# Patient Record
Sex: Female | Born: 1990 | Race: Black or African American | Hispanic: No | Marital: Single | Smoking: Never smoker
Health system: Southern US, Community
[De-identification: ages and names within clinical notes are randomized; demographics above are authoritative.]

## PROBLEM LIST (undated history)

## (undated) ENCOUNTER — Inpatient Hospital Stay (HOSPITAL_COMMUNITY): Payer: Self-pay

## (undated) ENCOUNTER — Ambulatory Visit: Admission: EM | Discharge status: Absent without leave | Payer: Medicaid Other

## (undated) ENCOUNTER — Emergency Department (HOSPITAL_COMMUNITY): Payer: Medicaid Other

## (undated) ENCOUNTER — Inpatient Hospital Stay (HOSPITAL_COMMUNITY): Payer: Medicaid Other

## (undated) DIAGNOSIS — Z9889 Other specified postprocedural states: Secondary | ICD-10-CM

## (undated) DIAGNOSIS — B999 Unspecified infectious disease: Secondary | ICD-10-CM

## (undated) DIAGNOSIS — O021 Missed abortion: Secondary | ICD-10-CM

## (undated) DIAGNOSIS — A749 Chlamydial infection, unspecified: Secondary | ICD-10-CM

## (undated) DIAGNOSIS — O411290 Chorioamnionitis, unspecified trimester, not applicable or unspecified: Secondary | ICD-10-CM

## (undated) DIAGNOSIS — O403XX0 Polyhydramnios, third trimester, not applicable or unspecified: Secondary | ICD-10-CM

## (undated) HISTORY — DX: Polyhydramnios, third trimester, not applicable or unspecified: O40.3XX0

## (undated) HISTORY — PX: DILATION AND CURETTAGE OF UTERUS: SHX78

## (undated) HISTORY — DX: Other specified postprocedural states: Z98.890

## (undated) HISTORY — DX: Chorioamnionitis, unspecified trimester, not applicable or unspecified: O41.1290

## (undated) HISTORY — DX: Missed abortion: O02.1

---
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
## 2014-06-13 HISTORY — PX: WISDOM TOOTH EXTRACTION: SHX21

## 2016-08-17 ENCOUNTER — Ambulatory Visit (HOSPITAL_COMMUNITY)
Admission: EM | Admit: 2016-08-17 | Discharge: 2016-08-17 | Disposition: A | Discharge status: Home / Self Care | Payer: Medicaid Other | Attending: Family Medicine | Admitting: Family Medicine

## 2016-08-17 ENCOUNTER — Encounter (HOSPITAL_COMMUNITY): Payer: Self-pay | Admitting: Emergency Medicine

## 2016-08-17 DIAGNOSIS — N390 Urinary tract infection, site not specified: Secondary | ICD-10-CM | POA: Diagnosis not present

## 2016-08-17 DIAGNOSIS — Z3202 Encounter for pregnancy test, result negative: Secondary | ICD-10-CM | POA: Diagnosis not present

## 2016-08-17 LAB — POCT URINALYSIS DIP (DEVICE)
Bilirubin Urine: NEGATIVE
GLUCOSE, UA: NEGATIVE mg/dL
Hgb urine dipstick: NEGATIVE
Ketones, ur: NEGATIVE mg/dL
Nitrite: POSITIVE — AB
PROTEIN: NEGATIVE mg/dL
Specific Gravity, Urine: >=1.03 (ref 1.005–1.030)
UROBILINOGEN UA: 0.2 mg/dL (ref 0.0–1.0)
pH: 5.5 (ref 5.0–8.0)

## 2016-08-17 LAB — POCT PREGNANCY, URINE: PREG TEST UR: NEGATIVE

## 2016-08-17 MED ORDER — FLUCONAZOLE 150 MG PO TABS: 150.0000 mg | ORAL_TABLET | Freq: Once | ORAL | 0 refills | Status: AC

## 2016-08-17 MED ORDER — SULFAMETHOXAZOLE-TRIMETHOPRIM 800-160 MG PO TABS: 1.0000 | ORAL_TABLET | Freq: Two times a day (BID) | ORAL | 0 refills | Status: DC

## 2016-08-17 NOTE — ED Triage Notes (Signed)
Pt started having urinary symptoms about a month ago.  Pt was put on antibiotics through the health department.  She stated that the symptoms went away but then she had new symptoms.  She was given Diflucan but the symptoms have not gone away.  Pt states she has a tingling feeling with urination, but denies any back pain or fever.  She does report some lower abdominal pain.

## 2016-08-17 NOTE — ED Notes (Signed)
Patient could only void enough for the cytology specimen. Provided water

## 2016-08-17 NOTE — ED Provider Notes (Addendum)
[facility]    CSN: [id number] Arrival date & time: 08/17/16  1011     History   Chief Complaint Chief Complaint  Patient presents with  . Urinary Tract Infection    HPI Christina Jennings is a 27 y.o. female.   This is a 26 year old woman who presents to the urgent care center with symptoms consistent with urinary tract infection.  Patient had a urinary tract infection about one month ago. She was treated at the health department and symptoms disappear. They started coming back about a week later and she was treated with Diflucan. She has not seen any improvement after taking the Diflucan.  Currently patient is having some burning when she he's and having some suprapubic tenderness. There is no nausea, vomiting, fever, or back pain. Last menstrual period was a little over a month ago. Patient is sexually active and is not taking birth control. She does not think she is pregnant.      History reviewed. No pertinent past medical history.  There are no active problems to display for this patient.   History reviewed. No pertinent surgical history.  OB History    No data available       Home Medications    Prior to Admission medications   Medication Sig Start Date End Date Taking? Authorizing Provider  fluconazole (DIFLUCAN) 150 MG tablet Take 1 tablet (150 mg total) by mouth once. Repeat if needed 08/17/16 08/17/16  Elvina Sidle, MD  sulfamethoxazole-trimethoprim (BACTRIM DS,SEPTRA DS) 800-160 MG tablet Take 1 tablet by mouth 2 (two) times daily. 08/17/16   Elvina Sidle, MD    Family History History reviewed. No pertinent family history.  Social History Social History  Substance Use Topics  . Smoking status: Never Smoker  . Smokeless tobacco: Never Used  . Alcohol use Yes     Comment: occasional     Allergies   Patient has no known allergies.   Review of Systems Review of Systems  Constitutional: Negative.   HENT: Negative.   Respiratory:  Negative.   Cardiovascular: Negative.   Gastrointestinal: Negative.   Genitourinary: Positive for dysuria and pelvic pain. Negative for flank pain, hematuria, menstrual problem and vaginal bleeding.  Neurological: Negative.      Physical Exam Triage Vital Signs ED Triage Vitals  Enc Vitals Group     BP      Pulse      Resp      Temp      Temp src      SpO2      Weight      Height      Head Circumference      Peak Flow      Pain Score      Pain Loc      Pain Edu?      Excl. in GC?    No data found.   Updated Vital Signs BP 110/73 (BP Location: Right Arm)   Pulse 62   Temp 98.2 F (36.8 C) (Oral)   LMP 07/20/2016 (Approximate)   SpO2 100%   Physical Exam  Constitutional: She is oriented to person, place, and time. She appears well-developed and well-nourished.  HENT:  Right Ear: External ear normal.  Left Ear: External ear normal.  Mouth/Throat: Oropharynx is clear and moist.  Eyes: Conjunctivae and EOM are normal. Pupils are equal, round, and reactive to light.  Neck: Normal range of motion. Neck supple.  Pulmonary/Chest: Effort normal.  Abdominal: Soft. Bowel sounds are  normal. She exhibits no distension.  Slight tenderness with deep palpation of suprapubic region  Musculoskeletal: Normal range of motion.  Neurological: She is alert and oriented to person, place, and time.  Skin: Skin is warm and dry.  Erythematous lower lip area consistent with birthmark  Nursing note and vitals reviewed.    UC Treatments / Results  Labs (all labs ordered are listed, but only abnormal results are displayed) Labs Reviewed  POCT URINALYSIS DIP (DEVICE) - Abnormal; Notable for the following:       Result Value   Nitrite POSITIVE (*)    Leukocytes, UA TRACE (*)    All other components within normal limits  URINE CULTURE  POCT PREGNANCY, URINE  URINE CYTOLOGY ANCILLARY ONLY    EKG  EKG Interpretation None       Radiology No results  found.  Procedures Procedures (including critical care time)  Medications Ordered in UC Medications - No data to display   Initial Impression / Assessment and Plan / UC Course  I have reviewed the triage vital signs and the nursing notes.  Pertinent labs & imaging results that were available during my care of the patient were reviewed by me and considered in my medical decision making (see chart for details).     Final Clinical Impressions(s) / UC Diagnoses   Final diagnoses:  Lower urinary tract infectious disease    New Prescriptions New Prescriptions   FLUCONAZOLE (DIFLUCAN) 150 MG TABLET    Take 1 tablet (150 mg total) by mouth once. Repeat if needed   SULFAMETHOXAZOLE-TRIMETHOPRIM (BACTRIM DS,SEPTRA DS) 800-160 MG TABLET    Take 1 tablet by mouth 2 (two) times daily.     Elvina SidleKurt Teegan Guinther, MD 08/17/16 1201    Elvina SidleKurt Schylar Allard, MD 08/17/16 [phone]

## 2016-08-18 LAB — URINE CYTOLOGY ANCILLARY ONLY
Chlamydia: NEGATIVE
Neisseria Gonorrhea: NEGATIVE
Trichomonas: NEGATIVE

## 2016-08-19 LAB — URINE CULTURE: Culture: >=100000 — AB

## 2016-08-22 LAB — URINE CYTOLOGY ANCILLARY ONLY: Candida vaginitis: NEGATIVE

## 2016-11-16 ENCOUNTER — Inpatient Hospital Stay (HOSPITAL_COMMUNITY)
Admission: AD | Admit: 2016-11-16 | Discharge: 2016-11-16 | Discharge status: Against Medical Advice | Payer: Medicaid Other | Source: Ambulatory Visit | Attending: Family Medicine | Admitting: Family Medicine

## 2016-11-16 ENCOUNTER — Encounter (HOSPITAL_COMMUNITY): Payer: Self-pay | Admitting: *Deleted

## 2016-11-16 DIAGNOSIS — O26891 Other specified pregnancy related conditions, first trimester: Secondary | ICD-10-CM | POA: Diagnosis not present

## 2016-11-16 DIAGNOSIS — R109 Unspecified abdominal pain: Secondary | ICD-10-CM | POA: Insufficient documentation

## 2016-11-16 DIAGNOSIS — Z5321 Procedure and treatment not carried out due to patient leaving prior to being seen by health care provider: Secondary | ICD-10-CM | POA: Diagnosis not present

## 2016-11-16 DIAGNOSIS — Z3A00 Weeks of gestation of pregnancy not specified: Secondary | ICD-10-CM | POA: Diagnosis not present

## 2016-11-16 LAB — URINALYSIS, ROUTINE W REFLEX MICROSCOPIC
BILIRUBIN URINE: NEGATIVE
GLUCOSE, UA: NEGATIVE mg/dL
Hgb urine dipstick: NEGATIVE
KETONES UR: 5 mg/dL — AB
LEUKOCYTES UA: NEGATIVE
NITRITE: NEGATIVE
PH: 5 (ref 5.0–8.0)
PROTEIN: NEGATIVE mg/dL
Specific Gravity, Urine: 1.025 (ref 1.005–1.030)

## 2016-11-16 LAB — POCT PREGNANCY, URINE: Preg Test, Ur: POSITIVE — AB

## 2016-11-16 NOTE — MAU Note (Signed)
Patient not in lobby

## 2016-11-16 NOTE — MAU Note (Signed)
1716 not in lobby #1

## 2016-11-16 NOTE — MAU Note (Signed)
+  HPT LMP 09/21/2016  Has made an appointment at health department--given appointment in July  +abdominal pain Cramping in nature Started 1 month ago and has not gotten better Has not taken anything for the pain Rating 7/10  +nausea

## 2016-11-30 ENCOUNTER — Emergency Department (HOSPITAL_COMMUNITY): Payer: Medicaid Other

## 2016-11-30 ENCOUNTER — Encounter (HOSPITAL_COMMUNITY): Payer: Self-pay | Admitting: *Deleted

## 2016-11-30 ENCOUNTER — Emergency Department (HOSPITAL_COMMUNITY)
Admission: EM | Admit: 2016-11-30 | Discharge: 2016-11-30 | Disposition: A | Discharge status: Home / Self Care | Payer: Medicaid Other | Attending: Physician Assistant | Admitting: Physician Assistant

## 2016-11-30 DIAGNOSIS — O9989 Other specified diseases and conditions complicating pregnancy, childbirth and the puerperium: Secondary | ICD-10-CM | POA: Diagnosis present

## 2016-11-30 DIAGNOSIS — O209 Hemorrhage in early pregnancy, unspecified: Secondary | ICD-10-CM | POA: Insufficient documentation

## 2016-11-30 DIAGNOSIS — Z3A10 10 weeks gestation of pregnancy: Secondary | ICD-10-CM | POA: Insufficient documentation

## 2016-11-30 DIAGNOSIS — R109 Unspecified abdominal pain: Secondary | ICD-10-CM

## 2016-11-30 DIAGNOSIS — O26891 Other specified pregnancy related conditions, first trimester: Secondary | ICD-10-CM

## 2016-11-30 LAB — COMPREHENSIVE METABOLIC PANEL
ALBUMIN: 3.7 g/dL (ref 3.5–5.0)
ALK PHOS: 41 U/L (ref 38–126)
ALT: 39 U/L (ref 14–54)
ANION GAP: 8 (ref 5–15)
AST: 29 U/L (ref 15–41)
BUN: 6 mg/dL (ref 6–20)
CO2: 23 mmol/L (ref 22–32)
Calcium: 9.9 mg/dL (ref 8.9–10.3)
Chloride: 106 mmol/L (ref 101–111)
Creatinine, Ser: 0.74 mg/dL (ref 0.44–1.00)
GFR calc non Af Amer: >60 mL/min (ref >60)
GLUCOSE: 97 mg/dL (ref 65–99)
POTASSIUM: 3.5 mmol/L (ref 3.5–5.1)
SODIUM: 137 mmol/L (ref 135–145)
Total Bilirubin: 0.4 mg/dL (ref 0.3–1.2)
Total Protein: 7.2 g/dL (ref 6.5–8.1)

## 2016-11-30 LAB — URINALYSIS, ROUTINE W REFLEX MICROSCOPIC
BACTERIA UA: NONE SEEN
Bilirubin Urine: NEGATIVE
GLUCOSE, UA: NEGATIVE mg/dL
KETONES UR: 20 mg/dL — AB
Leukocytes, UA: NEGATIVE
NITRITE: NEGATIVE
PROTEIN: NEGATIVE mg/dL
Specific Gravity, Urine: 1.021 (ref 1.005–1.030)
pH: 6 (ref 5.0–8.0)

## 2016-11-30 LAB — ABO/RH: ABO/RH(D): A POS

## 2016-11-30 LAB — WET PREP, GENITAL
Sperm: NONE SEEN
Trich, Wet Prep: NONE SEEN
Yeast Wet Prep HPF POC: NONE SEEN

## 2016-11-30 LAB — CBC
HEMATOCRIT: 35.4 % — AB (ref 36.0–46.0)
HEMOGLOBIN: 11.6 g/dL — AB (ref 12.0–15.0)
MCH: 25.8 pg — AB (ref 26.0–34.0)
MCHC: 32.8 g/dL (ref 30.0–36.0)
MCV: 78.7 fL (ref 78.0–100.0)
Platelets: 203 10*3/uL (ref 150–400)
RBC: 4.5 MIL/uL (ref 3.87–5.11)
RDW: 15 % (ref 11.5–15.5)
WBC: 7.6 10*3/uL (ref 4.0–10.5)

## 2016-11-30 LAB — I-STAT BETA HCG BLOOD, ED (MC, WL, AP ONLY)

## 2016-11-30 NOTE — ED Triage Notes (Signed)
Pt states she is [redacted] weeks pregnant with 2nd pregnancy.  She began lower abdominal cramping and today began noticing blood on toilet paper when she wiped.

## 2016-11-30 NOTE — ED Notes (Signed)
Previous charting entered in error by this RN

## 2016-11-30 NOTE — Discharge Instructions (Signed)
Your ultrasound today shows that you are 10 weeks and 3 days pregnant. We did not find the reason for the bleeding. If your symptoms worsen, go to the [facility] for further evaluation.

## 2016-11-30 NOTE — ED Provider Notes (Signed)
[facility]   CSN: [id number] Arrival date & time: 11/30/16  1520  By signing my name below, I, Christina Jennings, attest that this documentation has been prepared under the direction and in the presence of [facility], NP-C. Electronically Signed: Orpah Jennings , ED Scribe. 12/01/16. 1:16 AM.    History   Chief Complaint Chief Complaint  Patient presents with  . Abdominal Cramping    [redacted] weeks pregnant    HPI  Christina Jennings is a 26 y.o.  G1P0 @ [redacted]w[redacted]d gestation who presents to the Emergency Department complaining of intermittent, mild to moderate abdominal cramping with onset x1 day. Pt states that for the past x1 day she has experienced abdominal cramping to the lower abdomen. She states that earlier today she had x1 episode of vaginal bleeding. She has not received prenatal care. She rates the pain 5-6/10. She reports occasional headache. Pt denies any modifying factors. She denies nausea, vomiting, fever, chills, vision changes, back pain, dysuria, frequency. She denies hx of abortion, miscarriage, ectopic pregnancy. Of note, pt's blood type is A+.  The history is provided by the patient. No language interpreter was used.  Abdominal Cramping  This is a new problem. The current episode started 12 to 24 hours ago. The problem occurs constantly. The problem has not changed since onset.Associated symptoms include headaches. Pertinent negatives include no chest pain and no shortness of breath. Abdominal pain: occasional cramping. Nothing aggravates the symptoms. Nothing relieves the symptoms. She has tried nothing for the symptoms.    History reviewed. No pertinent past medical history.  There are no active problems to display for this patient.   Past Surgical History:  Procedure Laterality Date  . c-sx      OB History    Gravida Para Term Preterm AB Living   1             SAB TAB Ectopic Multiple Live Births                   Home Medications    Prior  to Admission medications   Medication Sig Start Date End Date Taking? Authorizing Provider  sulfamethoxazole-trimethoprim (BACTRIM DS,SEPTRA DS) 800-160 MG tablet Take 1 tablet by mouth 2 (two) times daily. 08/17/16   Elvina Sidle, MD    Family History No family history on file.  Social History Social History  Substance Use Topics  . Smoking status: Never Smoker  . Smokeless tobacco: Never Used  . Alcohol use No     Comment: occasional     Allergies   Patient has no known allergies.   Review of Systems Review of Systems  Constitutional: Negative for chills and fever.  HENT: Negative.   Eyes: Negative for visual disturbance.  Respiratory: Negative for shortness of breath.   Cardiovascular: Negative for chest pain.  Gastrointestinal: Negative for nausea and vomiting. Abdominal pain: occasional cramping.  Genitourinary: Positive for vaginal bleeding. Negative for dysuria, frequency and urgency.  Musculoskeletal: Negative for back pain.  Skin: Negative for rash.  Neurological: Positive for headaches. Negative for syncope and light-headedness.  Hematological: Negative for adenopathy.  Psychiatric/Behavioral: The patient is not nervous/anxious.      Physical Exam Updated Vital Signs BP 120/80   Pulse 72   Temp 98.5 F (36.9 C) (Oral)   Resp 16   Ht 5' (1.524 m)   Wt 68.9 kg (152 lb)   LMP 09/21/2016   SpO2 100%   BMI 29.69 kg/m   Physical Exam  Constitutional: She is oriented to person, place, and time. She appears well-developed and well-nourished. No distress.  HENT:  Head: Normocephalic.  Eyes: EOM are normal.  Neck: Neck supple.  Cardiovascular: Normal rate and regular rhythm.   Pulmonary/Chest: Effort normal and breath sounds normal.  Abdominal: Soft. Bowel sounds are normal. There is no tenderness.  Abdomen non-tender to palpation.  Genitourinary:  Genitourinary Comments: External genitalia without lesions, scant dark blood vaginal vault. Cervix  closed, long, no CMT, no adnexal tenderness, uterus approximately 10 week size.   Musculoskeletal: Normal range of motion.  Neurological: She is alert and oriented to person, place, and time. No cranial nerve deficit.  Skin: Skin is warm and dry.  Psychiatric: She has a normal mood and affect. Her behavior is normal.  Nursing note and vitals reviewed.    ED Treatments / Results   DIAGNOSTIC STUDIES: Oxygen Saturation is 100% on RA, normal by my interpretation.   COORDINATION OF CARE: 1:16 AM-Discussed next steps with pt. Pt verbalized understanding and is agreeable with the plan.    Labs (all labs ordered are listed, but only abnormal results are displayed) Labs Reviewed  WET PREP, GENITAL - Abnormal; Notable for the following:       Result Value   Clue Cells Wet Prep HPF POC PRESENT (*)    WBC, Wet Prep HPF POC MODERATE (*)    All other components within normal limits  CBC - Abnormal; Notable for the following:    Hemoglobin 11.6 (*)    HCT 35.4 (*)    MCH 25.8 (*)    All other components within normal limits  URINALYSIS, ROUTINE W REFLEX MICROSCOPIC - Abnormal; Notable for the following:    APPearance HAZY (*)    Hgb urine dipstick MODERATE (*)    Ketones, ur 20 (*)    Squamous Epithelial / LPF 6-30 (*)    All other components within normal limits  I-STAT BETA HCG BLOOD, ED ([facility], [facility], [facility] ONLY) - Abnormal; Notable for the following:    I-stat hCG, quantitative >2,000.0 (*)    All other components within normal limits  COMPREHENSIVE METABOLIC PANEL  ABO/RH  GC/CHLAMYDIA PROBE AMP ([place]) NOT AT [facility]   Radiology [place] Ob Comp Less 14 Wks  Result Date: 11/30/2016 CLINICAL DATA:  Abdominal cramping, spotting EXAM: OBSTETRIC <14 WK ULTRASOUND TECHNIQUE: Transabdominal ultrasound was performed for evaluation of the gestation as well as the maternal uterus and adnexal regions. COMPARISON:  None. FINDINGS: Intrauterine gestational sac: Single Yolk sac:  Visualized Embryo:   Visualized Cardiac Activity: Visualized Heart Rate: 139 bpm MSD:   mm    w     d CRL:   35.7  mm   10 w 3 d                  US EDC: 06/25/2017 Subchorionic hemorrhage:  None visualized. Maternal uterus/adnexae: No adnexal mass or free fluid. IMPRESSION: Ten week 3 day intrauterine pregnancy. Fetal heart rate 139 beats per minute. No acute maternal findings. Electronically Signed   By: Charlett NoseKevin  Dover M.D.   On: 11/30/2016 19:58    Procedures Procedures (including critical care time)  Medications Ordered in ED Medications - No data to display   Initial Impression / Assessment and Plan / ED Course  I have reviewed the triage vital signs and the nursing notes.  Pertinent labs & imaging results that were available during my care of the patient were reviewed by me and considered in my  medical decision making (see chart for details).  Final Clinical Impressions(s) / ED Diagnoses  26 y.o. female with spotting and cramping stable for d/c with viable IUP on ultrasound. Patient to start her prenatal care and go to [facility] if symptoms worsen.   Final diagnoses:  Abdominal pain during pregnancy in first trimester  Vaginal bleeding in pregnancy, first trimester    New Prescriptions Discharge Medication List as of 11/30/2016  8:59 PM    I personally performed the services described in this documentation, which was scribed in my presence. The recorded information has been reviewed and is accurate.    Kerrie [place], [place] 12/01/16 0120    Abelino Derrick, MD 12/02/16 1527

## 2016-11-30 NOTE — ED Notes (Signed)
On way to US 

## 2016-11-30 NOTE — ED Notes (Signed)
Patient complaining of abdominal cramping since last night and this morning she had blood on toilet paper when wiping.  States she is [redacted] weeks pregnant.  First pregnancy test done on 10/23/2016.  First OB GYN [facility] on [address].  Denies pain at this time and has not had anymore bleeding since this morning.

## 2016-11-30 NOTE — ED Notes (Signed)
Two unsuccessful attempts at blood draw. 

## 2016-11-30 NOTE — ED Notes (Signed)
Patient declines blood work.  States she is terrified of needles.

## 2016-12-01 LAB — GC/CHLAMYDIA PROBE AMP (~~LOC~~) NOT AT ARMC
Chlamydia: NEGATIVE
Neisseria Gonorrhea: NEGATIVE

## 2016-12-07 ENCOUNTER — Encounter (HOSPITAL_COMMUNITY): Payer: Self-pay | Admitting: *Deleted

## 2016-12-07 ENCOUNTER — Inpatient Hospital Stay (HOSPITAL_COMMUNITY)
Admission: AD | Admit: 2016-12-07 | Discharge: 2016-12-07 | Disposition: A | Discharge status: Home / Self Care | Payer: Medicaid Other | Source: Ambulatory Visit | Attending: Family Medicine | Admitting: Family Medicine

## 2016-12-07 DIAGNOSIS — B9689 Other specified bacterial agents as the cause of diseases classified elsewhere: Secondary | ICD-10-CM

## 2016-12-07 DIAGNOSIS — O9989 Other specified diseases and conditions complicating pregnancy, childbirth and the puerperium: Secondary | ICD-10-CM

## 2016-12-07 DIAGNOSIS — O26851 Spotting complicating pregnancy, first trimester: Secondary | ICD-10-CM | POA: Diagnosis not present

## 2016-12-07 DIAGNOSIS — N939 Abnormal uterine and vaginal bleeding, unspecified: Secondary | ICD-10-CM

## 2016-12-07 DIAGNOSIS — N760 Acute vaginitis: Secondary | ICD-10-CM

## 2016-12-07 DIAGNOSIS — Z3A11 11 weeks gestation of pregnancy: Secondary | ICD-10-CM | POA: Insufficient documentation

## 2016-12-07 DIAGNOSIS — R109 Unspecified abdominal pain: Secondary | ICD-10-CM | POA: Diagnosis present

## 2016-12-07 HISTORY — DX: Chlamydial infection, unspecified: A74.9

## 2016-12-07 HISTORY — DX: Unspecified infectious disease: B99.9

## 2016-12-07 LAB — URINALYSIS, ROUTINE W REFLEX MICROSCOPIC
BILIRUBIN URINE: NEGATIVE
GLUCOSE, UA: NEGATIVE mg/dL
Hgb urine dipstick: NEGATIVE
KETONES UR: 20 mg/dL — AB
Leukocytes, UA: NEGATIVE
NITRITE: NEGATIVE
Protein, ur: NEGATIVE mg/dL
Specific Gravity, Urine: 1.023 (ref 1.005–1.030)
pH: 6 (ref 5.0–8.0)

## 2016-12-07 MED ORDER — METRONIDAZOLE 500 MG PO TABS: 500.0000 mg | ORAL_TABLET | Freq: Two times a day (BID) | ORAL | 0 refills | Status: AC

## 2016-12-07 NOTE — MAU Note (Addendum)
[facility] Bleeding restarted; started on Monday intermittently through last night Light pink in color to brown at times Denies pain  Last intercourse last night

## 2016-12-07 NOTE — MAU Note (Signed)
Bleeding off and on since was seen at [facility] on the 20th.  US at that time showed viable IUP. Scant bleeding, noted when wipes. No pain today.

## 2016-12-07 NOTE — Progress Notes (Signed)
MAU HISTORY AND PHYSICAL  Chief Complaint:  No chief complaint on file.   Christina Jennings is a 26 y.o.  G2P1001 with IUP at 3047w0d presenting for intermittent spotting and abdominal cramping. Patient noted small spots of pink blood on toilet paper when she wiped last Wednesday, Monday, and last night. Otherwise, white discharge. No pain, itching, burning, no pain when urinates, No nausea or vomiting. No dyspareunia or foul odor.  Notes cramping - intermittent throughout day, LUQ/RUQ. Cramping not associated w bleeding  [facility] was [facility] in [facility] last week and noted to have BV on swab at that time. No anitibiotics given.  Past Medical History:  Diagnosis Date  . Chlamydia 2010  . Infection    UTI    Past Surgical History:  Procedure Laterality Date  . c-sx    . NO PAST SURGERIES      Family History  Problem Relation Age of Onset  . Heart disease Mother   . Heart disease Father   . Asthma Son   . Diabetes Maternal Aunt     Social History  Substance Use Topics  . Smoking status: Never Smoker  . Smokeless tobacco: Never Used  . Alcohol use No     Comment: occasional    No Known Allergies  Prescriptions Prior to Admission  Medication Sig Dispense Refill Last Dose  . sulfamethoxazole-trimethoprim (BACTRIM DS,SEPTRA DS) 800-160 MG tablet Take 1 tablet by mouth 2 (two) times daily. (Patient not taking: Reported on 12/07/2016) 20 tablet 0 Completed Course at Unknown time    Review of Systems - Negative except for what is mentioned in HPI.  Physical Exam  Blood pressure 112/69, pulse 76, temperature 98.4 F (36.9 C), temperature source Oral, resp. rate 16, weight 69.9 kg (154 lb 1.3 oz), last menstrual period 09/21/2016, SpO2 100 %. Abd: soft and nontender Cervical Exam: no blood in vaginal vault, normal labial mucosa, +thin white discharge from cervical os which was closed  Bed side US: IUP with HR of approximately 120.   Labs: Results for orders placed or performed during  the hospital encounter of 12/07/16 (from the past 24 hour(s))  Urinalysis, Routine w reflex microscopic   Collection Time: 12/07/16 11:05 AM  Result Value Ref Range   Color, Urine YELLOW YELLOW   APPearance CLEAR CLEAR   Specific Gravity, Urine 1.023 1.005 - 1.030   pH 6.0 5.0 - 8.0   Glucose, UA NEGATIVE NEGATIVE mg/dL   Hgb urine dipstick NEGATIVE NEGATIVE   Bilirubin Urine NEGATIVE NEGATIVE   Ketones, ur 20 (A) NEGATIVE mg/dL   Protein, ur NEGATIVE NEGATIVE mg/dL   Nitrite NEGATIVE NEGATIVE   Leukocytes, UA NEGATIVE NEGATIVE    Imaging Studies:  [place] Ob Comp Less 14 Wks  Result Date: 11/30/2016 CLINICAL DATA:  Abdominal cramping, spotting EXAM: OBSTETRIC <14 WK ULTRASOUND TECHNIQUE: Transabdominal ultrasound was performed for evaluation of the gestation as well as the maternal uterus and adnexal regions. COMPARISON:  None. FINDINGS: Intrauterine gestational sac: Single Yolk sac:  Visualized Embryo:  Visualized Cardiac Activity: Visualized Heart Rate: 139 bpm MSD:   mm    w     d CRL:   35.7  mm   10 w 3 d                  US EDC: 06/25/2017 Subchorionic hemorrhage:  None visualized. Maternal uterus/adnexae: No adnexal mass or free fluid. IMPRESSION: Ten week 3 day intrauterine pregnancy. Fetal heart rate 139 beats per minute. No acute maternal  findings. Electronically Signed   By: Charlett Nose M.D.   On: 11/30/2016 19:58    Assessment: Christina Jennings is  26 y.o. G2P1001 at [redacted]w[redacted]d presents with mild vaginal spotting, noted to have BV on recent studies one week ago.  Plan: BV  - given s/s of bleeding and discharge, will treat this infection. - metronidazole 500 mg BID x7d - return precautions advised  Howard Pouch, MD 6/27/201811:38 AM

## 2016-12-22 ENCOUNTER — Inpatient Hospital Stay (HOSPITAL_COMMUNITY)
Admission: AD | Admit: 2016-12-22 | Discharge: 2016-12-22 | Disposition: A | Discharge status: Home / Self Care | Payer: Medicaid Other | Source: Ambulatory Visit | Attending: Obstetrics & Gynecology | Admitting: Obstetrics & Gynecology

## 2016-12-22 ENCOUNTER — Inpatient Hospital Stay (HOSPITAL_COMMUNITY): Payer: Medicaid Other

## 2016-12-22 DIAGNOSIS — Z3A13 13 weeks gestation of pregnancy: Secondary | ICD-10-CM | POA: Diagnosis not present

## 2016-12-22 DIAGNOSIS — O021 Missed abortion: Secondary | ICD-10-CM | POA: Diagnosis not present

## 2016-12-22 DIAGNOSIS — IMO0002 Reserved for concepts with insufficient information to code with codable children: Secondary | ICD-10-CM

## 2016-12-22 NOTE — MAU Provider Note (Signed)
Chief Complaint: Referral   First Provider Initiated Contact with Patient 12/22/16 1328        SUBJECTIVE HPI: Christina Jennings is a 26 y.o. G2P1001 at [redacted]w[redacted]d by LMP who presents to maternity admissions reporting Health department not being able to hear fetal hear tones.  Denies bleeding or pain. She denies vaginal bleeding, vaginal itching/burning, urinary symptoms, h/a, dizziness, n/v, or fever/chills.    Other  This is a new problem. The current episode started today. The problem has been unchanged. Pertinent negatives include no abdominal pain, fever, myalgias, nausea, urinary symptoms or vomiting. Nothing aggravates the symptoms. She has tried nothing for the symptoms.    RN Note: Pt reports she was at the [facility] for her initial visit and they were unable to get FHT's so they told her to come here.  Past Medical History:  Diagnosis Date  . Chlamydia 2010  . Infection    UTI   Past Surgical History:  Procedure Laterality Date  . c-sx    . NO PAST SURGERIES     Social History   Social History  . Marital status: Single    Spouse name: N/A  . Number of children: N/A  . Years of education: N/A   Occupational History  . Not on file.   Social History Main Topics  . Smoking status: Never Smoker  . Smokeless tobacco: Never Used  . Alcohol use No     Comment: occasional  . Drug use: No  . Sexual activity: Yes   Other Topics Concern  . Not on file   Social History Narrative  . No narrative on file   No current facility-administered medications on file prior to encounter.    No current outpatient prescriptions on file prior to encounter.   No Known Allergies  I have reviewed patient's Past Medical Hx, Surgical Hx, Family Hx, Social Hx, medications and allergies.   ROS:  Review of Systems  Constitutional: Negative for fever.  Gastrointestinal: Negative for abdominal pain, nausea and vomiting.  Musculoskeletal: Negative for myalgias.   Review of Systems  Other  systems negative   Physical Exam  Physical Exam Patient Vitals for the past 24 hrs:  BP Temp Temp src Pulse Resp SpO2 Height Weight  12/22/16 1324 119/66 98.6 F (37 C) Oral 96 15 99 % 5' (1.524 m) 151 lb (68.5 kg)   Constitutional: Well-developed, well-nourished female in no acute distress.  Cardiovascular: normal rate Respiratory: normal effort GI: Abd soft, non-tender. Pos BS x 4 MS: Extremities nontender, no edema, normal ROM Neurologic: Alert and oriented x 4.  GU: Neg CVAT.  PELVIC EXAM: deferred  Bedside US showed 13 wk fetus with no fetal heart motion.    LAB RESULTS No results found for this or any previous visit (from the past 24 hour(s)).  --/--/A POS (06/20 1657)  IMAGING US Ob Comp Less 14 Wks  Result Date: 12/22/2016 CLINICAL DATA:  Current assigned gestational age of [redacted] weeks 1 day. No audible fetal Doppler heart tones. EXAM: OBSTETRIC <14 WK ULTRASOUND TECHNIQUE: Transabdominal ultrasound was performed for evaluation of the gestation as well as the maternal uterus and adnexal regions. COMPARISON:  11/30/2016 FINDINGS: Intrauterine gestational sac: Single Yolk sac:  Not Visualized. Embryo:  Visualized. Cardiac Activity: Absent CRL:   77  mm   13 w 5 d                  [place] EDC: 06/24/2017 Subchorionic hemorrhage:  None visualized. Maternal uterus/adnexae: No  mass or free fluid identified. IMPRESSION: No fetal cardiac activity, consistent with failed IUP. Electronically Signed   By: Myles RosenthalJohn  Stahl M.D.   On: 12/22/2016 14:05   [place] Ob Comp Less 14 Wks  Result Date: 11/30/2016 CLINICAL DATA:  Abdominal cramping, spotting EXAM: OBSTETRIC <14 WK ULTRASOUND TECHNIQUE: Transabdominal ultrasound was performed for evaluation of the gestation as well as the maternal uterus and adnexal regions. COMPARISON:  None. FINDINGS: Intrauterine gestational sac: Single Yolk sac:  Visualized Embryo:  Visualized Cardiac Activity: Visualized Heart Rate: 139 bpm MSD:   mm    w     d CRL:   35.7  mm    10 w 3 d                  US EDC: 06/25/2017 Subchorionic hemorrhage:  None visualized. Maternal uterus/adnexae: No adnexal mass or free fluid. IMPRESSION: Ten week 3 day intrauterine pregnancy. Fetal heart rate 139 beats per minute. No acute maternal findings. Electronically Signed   By: Charlett NoseKevin  Dover M.D.   On: 11/30/2016 19:58    MAU Management/MDM: I ordered a formal US to confirm findings This was confirmed at [facility] with probable hydroptic appearance. Consult Dr Erin FullingHarraway-Smith with presentation, exam findings, and results.   She will come to discuss plan of care with patient.    ASSESSMENT 1. Fetal heart tones not heard     PLAN Discharge home Surgery planned per Dr Erin FullingHarraway-Smith  Pt stable at time of discharge. Encouraged to return here or to other Urgent Care/ED if she develops worsening of symptoms, increase in pain, fever, or other concerning symptoms.    Wynelle BourgeoisMarie Williams CNM, MSN Certified Nurse-Midwife 12/22/2016  2:48 PM

## 2016-12-22 NOTE — MAU Note (Signed)
POC discusses with pt by Dr. Erin FullingHarraway-Smith. Surgery scheduled for tomorrow

## 2016-12-22 NOTE — Progress Notes (Signed)
Provider at bs assessing pt. POC being discussed with pt for options of surgery. Pt desires to do surgery tomorrow instead and informed not to eat or drink past midnight tonight.

## 2016-12-22 NOTE — MAU Note (Signed)
Pt reports [facility] was [facility] for [facility] and they were unable to get FHT's so they told her to come here.

## 2016-12-23 ENCOUNTER — Encounter (HOSPITAL_COMMUNITY): Admission: RE | Payer: Self-pay | Source: Ambulatory Visit

## 2016-12-23 ENCOUNTER — Ambulatory Visit (HOSPITAL_COMMUNITY)
Admission: RE | Admit: 2016-12-23 | Payer: Medicaid Other | Source: Ambulatory Visit | Admitting: Obstetrics & Gynecology

## 2016-12-23 ENCOUNTER — Encounter (HOSPITAL_COMMUNITY): Payer: Self-pay | Admitting: Anesthesiology

## 2016-12-23 SURGERY — DILATION AND EVACUATION, UTERUS
Anesthesia: Choice

## 2016-12-23 SURGERY — DILATION AND EVACUATION, UTERUS
Anesthesia: General

## 2016-12-23 SURGERY — DILATION AND EVACUATION, UTERUS, SECOND TRIMESTER
Anesthesia: Choice

## 2016-12-23 NOTE — Anesthesia Preprocedure Evaluation (Deleted)
Anesthesia Evaluation  Patient identified by MRN, date of birth, ID band Patient awake    Reviewed: Allergy & Precautions, NPO status , Patient's Chart, lab work & pertinent test results  Airway Mallampati: II  TM Distance: >3 FB Neck ROM: Full    Dental no notable dental hx.    Pulmonary neg pulmonary ROS,    Pulmonary exam normal breath sounds clear to auscultation       Cardiovascular negative cardio ROS Normal cardiovascular exam Rhythm:Regular Rate:Normal     Neuro/Psych negative neurological ROS  negative psych ROS   GI/Hepatic negative GI ROS, Neg liver ROS,   Endo/Other  negative endocrine ROS  Renal/GU negative Renal ROS  negative genitourinary   Musculoskeletal negative musculoskeletal ROS (+)   Abdominal   Peds negative pediatric ROS (+)  Hematology negative hematology ROS (+)   Anesthesia Other Findings   Reproductive/Obstetrics negative OB ROS                             Anesthesia Physical Anesthesia Plan  ASA: I  Anesthesia Plan: General   Post-op Pain Management:    Induction: Intravenous  PONV Risk Score and Plan:   Airway Management Planned: LMA  Additional Equipment:   Intra-op Plan:   Post-operative Plan: Extubation in OR  Informed Consent: I have reviewed the patients History and Physical, chart, labs and discussed the procedure including the risks, benefits and alternatives for the proposed anesthesia with the patient or authorized representative who has indicated his/her understanding and acceptance.   Dental advisory given  Plan Discussed with: CRNA  Anesthesia Plan Comments:         Anesthesia Quick Evaluation

## 2016-12-28 ENCOUNTER — Telehealth (HOSPITAL_COMMUNITY): Payer: Self-pay

## 2016-12-28 ENCOUNTER — Encounter (HOSPITAL_COMMUNITY): Payer: Self-pay

## 2016-12-28 NOTE — Telephone Encounter (Signed)
Patient no showed for D&C scheduled for 12/23/2016. Rescheduled to 12/29/2016.  Called and spoke with Christina Jennings, advised her that [facility] (arrive at 9:00am), enter through main entrance, stop at the desk to your right, let them know you are here for surgery. NPO after midnight. No lotions, perfumes, powder, a little bit of deodorant is okay. Remove all jewelry before arriving. Patient expressed understanding. Advised patient to call back if she had any questions.

## 2016-12-29 ENCOUNTER — Ambulatory Visit (HOSPITAL_COMMUNITY): Payer: Medicaid Other | Admitting: Anesthesiology

## 2016-12-29 ENCOUNTER — Ambulatory Visit (HOSPITAL_COMMUNITY)
Admission: RE | Admit: 2016-12-29 | Discharge: 2016-12-29 | Disposition: A | Discharge status: Home / Self Care | Payer: Medicaid Other | Source: Ambulatory Visit | Attending: Obstetrics and Gynecology | Admitting: Obstetrics and Gynecology

## 2016-12-29 ENCOUNTER — Ambulatory Visit (HOSPITAL_COMMUNITY): Payer: Medicaid Other

## 2016-12-29 ENCOUNTER — Encounter (HOSPITAL_COMMUNITY)
Admission: RE | Disposition: A | Discharge status: Home / Self Care | Payer: Self-pay | Source: Ambulatory Visit | Attending: Obstetrics and Gynecology

## 2016-12-29 ENCOUNTER — Encounter (HOSPITAL_COMMUNITY): Payer: Self-pay | Admitting: Anesthesiology

## 2016-12-29 DIAGNOSIS — Z9889 Other specified postprocedural states: Secondary | ICD-10-CM

## 2016-12-29 DIAGNOSIS — O021 Missed abortion: Secondary | ICD-10-CM | POA: Diagnosis not present

## 2016-12-29 DIAGNOSIS — Z419 Encounter for procedure for purposes other than remedying health state, unspecified: Secondary | ICD-10-CM

## 2016-12-29 HISTORY — DX: Other specified postprocedural states: Z98.890

## 2016-12-29 HISTORY — DX: Missed abortion: O02.1

## 2016-12-29 HISTORY — PX: DILATION AND EVACUATION: SHX1459

## 2016-12-29 SURGERY — DILATION AND EVACUATION, UTERUS
Anesthesia: General | Site: Vagina

## 2016-12-29 MED ORDER — MIDAZOLAM HCL 2 MG/2ML IJ SOLN
INTRAMUSCULAR | Status: AC
  Filled 2016-12-29: qty 2

## 2016-12-29 MED ORDER — ONDANSETRON HCL 4 MG/2ML IJ SOLN
INTRAMUSCULAR | Status: AC
  Filled 2016-12-29: qty 2

## 2016-12-29 MED ORDER — BUPIVACAINE HCL 0.25 % IJ SOLN
INTRAMUSCULAR | Status: DC | PRN
  Administered 2016-12-29: 20 mL

## 2016-12-29 MED ORDER — BUPIVACAINE HCL (PF) 0.25 % IJ SOLN
INTRAMUSCULAR | Status: AC
Start: 2016-12-29 — End: ?
  Filled 2016-12-29: qty 30

## 2016-12-29 MED ORDER — PROPOFOL 10 MG/ML IV BOLUS
INTRAVENOUS | Status: AC
  Filled 2016-12-29: qty 40

## 2016-12-29 MED ORDER — MISOPROSTOL 200 MCG PO TABS
ORAL_TABLET | ORAL | Status: AC
  Filled 2016-12-29: qty 1

## 2016-12-29 MED ORDER — DEXAMETHASONE SODIUM PHOSPHATE 4 MG/ML IJ SOLN
INTRAMUSCULAR | Status: AC
  Filled 2016-12-29: qty 1

## 2016-12-29 MED ORDER — FENTANYL CITRATE (PF) 100 MCG/2ML IJ SOLN
INTRAMUSCULAR | Status: AC
  Filled 2016-12-29: qty 2

## 2016-12-29 MED ORDER — PROPOFOL 10 MG/ML IV BOLUS
INTRAVENOUS | Status: AC
  Filled 2016-12-29: qty 20

## 2016-12-29 MED ORDER — OXYCODONE-ACETAMINOPHEN 5-325 MG PO TABS: 1.0000 | ORAL_TABLET | ORAL | 0 refills | Status: DC | PRN

## 2016-12-29 MED ORDER — MIDAZOLAM HCL 5 MG/5ML IJ SOLN
INTRAMUSCULAR | Status: DC | PRN
  Administered 2016-12-29 (×2): 1 mg via INTRAVENOUS

## 2016-12-29 MED ORDER — LIDOCAINE HCL (CARDIAC) 20 MG/ML IV SOLN
INTRAVENOUS | Status: AC
  Filled 2016-12-29: qty 5

## 2016-12-29 MED ORDER — PROPOFOL 10 MG/ML IV BOLUS
INTRAVENOUS | Status: DC | PRN
  Administered 2016-12-29: 170 mg via INTRAVENOUS

## 2016-12-29 MED ORDER — SCOPOLAMINE 1 MG/3DAYS TD PT72
MEDICATED_PATCH | TRANSDERMAL | Status: AC
  Administered 2016-12-29: 1.5 mg via TRANSDERMAL
  Filled 2016-12-29: qty 1

## 2016-12-29 MED ORDER — FENTANYL CITRATE (PF) 100 MCG/2ML IJ SOLN
INTRAMUSCULAR | Status: AC
  Administered 2016-12-29: 50 ug via INTRAVENOUS
  Filled 2016-12-29: qty 2

## 2016-12-29 MED ORDER — LACTATED RINGERS IV SOLN
INTRAVENOUS | Status: DC
  Administered 2016-12-29 (×3): via INTRAVENOUS

## 2016-12-29 MED ORDER — KETOROLAC TROMETHAMINE 30 MG/ML IJ SOLN
INTRAMUSCULAR | Status: DC | PRN
  Administered 2016-12-29: 30 mg via INTRAVENOUS

## 2016-12-29 MED ORDER — KETOROLAC TROMETHAMINE 30 MG/ML IJ SOLN
INTRAMUSCULAR | Status: AC
  Filled 2016-12-29: qty 1

## 2016-12-29 MED ORDER — ONDANSETRON HCL 4 MG/2ML IJ SOLN
INTRAMUSCULAR | Status: DC | PRN
  Administered 2016-12-29: 4 mg via INTRAVENOUS

## 2016-12-29 MED ORDER — SCOPOLAMINE 1 MG/3DAYS TD PT72
1.0000 | MEDICATED_PATCH | Freq: Once | TRANSDERMAL | Status: DC
  Administered 2016-12-29: 1.5 mg via TRANSDERMAL

## 2016-12-29 MED ORDER — LIDOCAINE HCL (CARDIAC) 20 MG/ML IV SOLN
INTRAVENOUS | Status: DC | PRN
  Administered 2016-12-29: 70 mg via INTRAVENOUS
  Administered 2016-12-29: 30 mg via INTRAVENOUS

## 2016-12-29 MED ORDER — MEPERIDINE HCL 25 MG/ML IJ SOLN: 6.2500 mg | INTRAMUSCULAR | Status: DC | PRN

## 2016-12-29 MED ORDER — DEXAMETHASONE SODIUM PHOSPHATE 10 MG/ML IJ SOLN
INTRAMUSCULAR | Status: DC | PRN
  Administered 2016-12-29: 4 mg via INTRAVENOUS

## 2016-12-29 MED ORDER — FENTANYL CITRATE (PF) 250 MCG/5ML IJ SOLN
INTRAMUSCULAR | Status: AC
  Filled 2016-12-29: qty 5

## 2016-12-29 MED ORDER — FENTANYL CITRATE (PF) 100 MCG/2ML IJ SOLN
25.0000 ug | INTRAMUSCULAR | Status: DC | PRN
  Administered 2016-12-29 (×2): 50 ug via INTRAVENOUS

## 2016-12-29 MED ORDER — BUPIVACAINE HCL (PF) 0.25 % IJ SOLN
INTRAMUSCULAR | Status: AC
  Filled 2016-12-29: qty 30

## 2016-12-29 MED ORDER — DOCUSATE SODIUM 100 MG PO CAPS: 100.0000 mg | ORAL_CAPSULE | Freq: Two times a day (BID) | ORAL | 0 refills | Status: DC

## 2016-12-29 MED ORDER — ONDANSETRON HCL 4 MG/2ML IJ SOLN: 4.0000 mg | Freq: Once | INTRAMUSCULAR | Status: DC | PRN

## 2016-12-29 MED ORDER — FENTANYL CITRATE (PF) 100 MCG/2ML IJ SOLN
INTRAMUSCULAR | Status: DC | PRN
  Administered 2016-12-29 (×3): 50 ug via INTRAVENOUS

## 2016-12-29 MED ORDER — MISOPROSTOL 200 MCG PO TABS
ORAL_TABLET | ORAL | Status: DC | PRN
  Administered 2016-12-29: 200 ug via RECTAL

## 2016-12-29 MED ORDER — IBUPROFEN 600 MG PO TABS: 600.0000 mg | ORAL_TABLET | Freq: Four times a day (QID) | ORAL | 0 refills | Status: DC | PRN

## 2016-12-29 SURGICAL SUPPLY — 19 items
CATH ROBINSON RED A/P 16FR (CATHETERS) ×3 IMPLANT
CLOTH BEACON ORANGE TIMEOUT ST (SAFETY) ×3 IMPLANT
DECANTER SPIKE VIAL GLASS SM (MISCELLANEOUS) ×3 IMPLANT
GLOVE BIO SURGEON STRL SZ7.5 (GLOVE) ×3 IMPLANT
GLOVE BIOGEL PI IND STRL 7.0 (GLOVE) ×1 IMPLANT
GLOVE BIOGEL PI INDICATOR 7.0 (GLOVE) ×2
GOWN STRL REUS W/TWL LRG LVL3 (GOWN DISPOSABLE) ×3 IMPLANT
GOWN STRL REUS W/TWL XL LVL3 (GOWN DISPOSABLE) ×3 IMPLANT
KIT BERKELEY 1ST TRIMESTER 3/8 (MISCELLANEOUS) ×3 IMPLANT
NS IRRIG 1000ML POUR BTL (IV SOLUTION) ×3 IMPLANT
PACK VAGINAL MINOR WOMEN LF (CUSTOM PROCEDURE TRAY) ×3 IMPLANT
PAD OB MATERNITY 4.3X12.25 (PERSONAL CARE ITEMS) ×3 IMPLANT
PAD PREP 24X48 CUFFED NSTRL (MISCELLANEOUS) ×3 IMPLANT
SET BERKELEY SUCTION TUBING (SUCTIONS) ×3 IMPLANT
TOWEL OR 17X24 6PK STRL BLUE (TOWEL DISPOSABLE) ×6 IMPLANT
VACURETTE 10 RIGID CVD (CANNULA) ×3 IMPLANT
VACURETTE 7MM CVD STRL WRAP (CANNULA) IMPLANT
VACURETTE 8 RIGID CVD (CANNULA) IMPLANT
VACURETTE 9 RIGID CVD (CANNULA) IMPLANT

## 2016-12-29 NOTE — Transfer of Care (Signed)
Immediate Anesthesia Transfer of Care Note  Patient: Christina Jennings  Procedure(s) Performed: Procedure(s): DILATATION AND EVACUATION (N/A)  Patient Location: PACU  Anesthesia Type:General  Level of Consciousness: awake  Airway & Oxygen Therapy: Patient Spontanous Breathing and Patient connected to nasal cannula oxygen  Post-op Assessment: Report given to RN and Post -op Vital signs reviewed and stable  Post vital signs: Reviewed and stable  Last Vitals:  Vitals:   12/29/16 1545 12/29/16 1600  BP: (!) 99/55 (!) 97/59  Pulse: 61 (!) 54  Resp: 15 17  Temp:      Last Pain:  Vitals:   12/29/16 1600  TempSrc:   PainSc: 2       Patients Stated Pain Goal: 3 (12/29/16 0930)  Complications: No apparent anesthesia complications

## 2016-12-29 NOTE — Anesthesia Preprocedure Evaluation (Signed)
Anesthesia Evaluation  Patient identified by MRN, date of birth, ID band Patient awake    Reviewed: Allergy & Precautions, NPO status , Patient's Chart, lab work & pertinent test results  Airway Mallampati: II  TM Distance: >3 FB Neck ROM: Full    Dental no notable dental hx.    Pulmonary neg pulmonary ROS,    Pulmonary exam normal breath sounds clear to auscultation       Cardiovascular negative cardio ROS Normal cardiovascular exam Rhythm:Regular Rate:Normal     Neuro/Psych negative neurological ROS  negative psych ROS   GI/Hepatic negative GI ROS, Neg liver ROS,   Endo/Other  negative endocrine ROS  Renal/GU negative Renal ROS  negative genitourinary   Musculoskeletal negative musculoskeletal ROS (+)   Abdominal   Peds negative pediatric ROS (+)  Hematology negative hematology ROS (+)   Anesthesia Other Findings   Reproductive/Obstetrics negative OB ROS                             Anesthesia Physical  Anesthesia Plan  ASA: I  Anesthesia Plan: General   Post-op Pain Management:    Induction: Intravenous  PONV Risk Score and Plan: 2 and Ondansetron, Dexamethasone, Treatment may vary due to age or medical condition and Propofol  Airway Management Planned: LMA  Additional Equipment:   Intra-op Plan:   Post-operative Plan: Extubation in OR  Informed Consent: I have reviewed the patients History and Physical, chart, labs and discussed the procedure including the risks, benefits and alternatives for the proposed anesthesia with the patient or authorized representative who has indicated his/her understanding and acceptance.   Dental advisory given  Plan Discussed with: CRNA  Anesthesia Plan Comments:         Anesthesia Quick Evaluation

## 2016-12-29 NOTE — Anesthesia Postprocedure Evaluation (Signed)
Anesthesia Post Note  Patient: Christina Jennings  Procedure(s) Performed: Procedure(s) (LRB): DILATATION AND EVACUATION (N/A)     Patient location during evaluation: PACU Anesthesia Type: General Level of consciousness: awake Pain management: pain level controlled Vital Signs Assessment: post-procedure vital signs reviewed and stable Respiratory status: spontaneous breathing Cardiovascular status: stable Postop Assessment: no signs of nausea or vomiting Anesthetic complications: no    Last Vitals:  Vitals:   12/29/16 1613 12/29/16 1650  BP: 107/67 112/64  Pulse: 62 74  Resp: 19 18  Temp: 36.9 C 36.9 C    Last Pain:  Vitals:   12/29/16 1613  TempSrc:   PainSc: 2    Pain Goal: Patients Stated Pain Goal: 3 (12/29/16 0930)               Daneli Butkiewicz JR,JOHN Susann GivensFRANKLIN
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
## 2016-12-29 NOTE — H&P (Signed)
Christina Jennings is an 26 y.o. G2P1001 IUP 14 1/7 weeks who presents for D & E due to 14 1/7 weeks missed AB.  female She reports no bleeding or cramping. MAB confirmed by U/S. Blood type A positive   Menstrual History: Menarche age: [age] Patient's last menstrual period was 09/21/2016.    Past Medical History:  Diagnosis Date  . Chlamydia 2010  . Infection    UTI    Past Surgical History:  Procedure Laterality Date  . c-sx    . NO PAST SURGERIES      Family History  Problem Relation Age of Onset  . Heart disease Mother   . Heart disease Father   . Asthma Son   . Diabetes Maternal Aunt     Social History:  reports that she has never smoked. She has never used smokeless tobacco. She reports that she does not drink alcohol or use drugs.  Allergies: No Known Allergies  No prescriptions prior to admission.    Review of Systems  Constitutional: Negative.   Respiratory: Negative.   Cardiovascular: Negative.   Gastrointestinal: Negative.   Genitourinary: Negative.     Blood pressure 118/79, pulse 80, temperature 98.3 F (36.8 C), temperature source Oral, resp. rate 16, last menstrual period 09/21/2016, SpO2 99 %. Physical Exam  Constitutional: She appears well-developed and well-nourished.  Cardiovascular: Normal rate and regular rhythm.   Respiratory: Effort normal and breath sounds normal.  GI: Soft. Bowel sounds are normal.  Genitourinary:  Genitourinary Comments: Deferred to OR    No results found for this or any previous visit (from the past 24 hour(s)).  No results found.  Assessment/Plan: Missed AB at 14 weeks  Pt is scheduled for D & E. R/B/Post op care have been reviewed with the pt. Pt has verbalized understanding and agrees to proceed.     Hermina StaggersMichael L Breyonna Nault 12/29/2016, 9:40 AM

## 2016-12-29 NOTE — Anesthesia Procedure Notes (Signed)
Procedure Name: LMA Insertion Date/Time: 12/29/2016 2:15 PM Performed by: Suella GroveMOORE, Siddhartha Hoback C Pre-anesthesia Checklist: Patient identified, Emergency Drugs available, Suction available and Patient being monitored Patient Re-evaluated:Patient Re-evaluated prior to induction Oxygen Delivery Method: Circle system utilized and Simple face mask Preoxygenation: Pre-oxygenation with 100% oxygen Induction Type: IV induction Ventilation: Mask ventilation without difficulty LMA: LMA inserted LMA Size: 4.0 Grade View: Grade II Tube type: Oral Number of attempts: 1 Placement Confirmation: positive ETCO2 and breath sounds checked- equal and bilateral Dental Injury: Teeth and Oropharynx as per pre-operative assessment

## 2016-12-29 NOTE — Op Note (Signed)
Christina Jennings PROCEDURE DATE: 12/29/2016  PREOPERATIVE DIAGNOSIS: 14 week missed abortion POSTOPERATIVE DIAGNOSIS: The same PROCEDURE:     Dilation and Evacuation under ultrasound guidance SURGEON:  Dr. Nettie ElmMichael Garlene Apperson ASSISTANT: Dr. Jen MowElizabeth Mumaw  INDICATIONS: 26 y.o. G2P1001 with MAB at [redacted] weeks gestation, needing surgical completion.  Risks of surgery were discussed with the patient including but not limited to: bleeding which may require transfusion; infection which may require antibiotics; injury to uterus or surrounding organs; need for additional procedures including laparotomy or laparoscopy; possibility of intrauterine scarring which may impair future fertility; and other postoperative/anesthesia complications. Written informed consent was obtained.    FINDINGS:  A 14 week size uterus, moderate amounts of products of conception, specimen sent to pathology.  ANESTHESIA:    Monitored intravenous sedation, paracervical block. INTRAVENOUS FLUIDS:  1200 ml of LR URINE OUTPUT: 50cc straight catheter ESTIMATED BLOOD LOSS:  200 ml. SPECIMENS:  Products of conception sent to pathology COMPLICATIONS:  None immediate.  PROCEDURE DETAILS:  The patient was taken to the operating room where monitored intravenous sedation was administered and was found to be adequate.  After an adequate timeout was performed, she was placed in the dorsal lithotomy position and examined; then prepped and draped in the sterile manner.   Her bladder was catheterized for 50cc of clear, yellow urine. A vaginal speculum was then placed in the patient's vagina and a single tooth tenaculum was applied to the anterior lip of the cervix.  A paracervical block using 20 ml of 0.5% Marcaine was administered. The cervix was gently dilated to accommodate a 10 mm suction curette that was gently advanced to the uterine fundus.  The suction device was then activated and curette slowly rotated to clear the uterus of products of conception  under ultrasound guidance.  A sharp curettage was then performed to cry to confirm complete emptying of the uterus, ultrasound also confirmed complete emptying of uterus. There was minimal bleeding noted and the tenaculum removed with good hemostasis noted.   All instruments were removed from the patient's vagina.  Sponge and instrument counts were correct times two  The patient tolerated the procedure well and was taken to the recovery area awake, and in stable condition.  The patient will be discharged to home as per PACU criteria.  Routine postoperative instructions given.  She was prescribed Percocet, Ibuprofen and Colace.  She will follow up in the clinic in 4 weeks for postoperative evaluation.   Cleda ClarksElizabeth W. Mumaw, DO  OB Fellow 12/29/2016 3:03 PM  I was present, gowned and gloved during the procedure. Nettie ElmMichael Kitti Mcclish, MD OB/GYN Faculty

## 2016-12-29 NOTE — Discharge Instructions (Signed)

## 2016-12-30 ENCOUNTER — Encounter (HOSPITAL_COMMUNITY): Payer: Self-pay | Admitting: Obstetrics and Gynecology

## 2017-02-07 ENCOUNTER — Encounter (HOSPITAL_COMMUNITY): Payer: Self-pay | Admitting: Emergency Medicine

## 2017-02-07 ENCOUNTER — Ambulatory Visit (HOSPITAL_COMMUNITY)
Admission: EM | Admit: 2017-02-07 | Discharge: 2017-02-07 | Disposition: A | Discharge status: Home / Self Care | Payer: Medicaid Other | Attending: Emergency Medicine | Admitting: Emergency Medicine

## 2017-02-07 DIAGNOSIS — B9689 Other specified bacterial agents as the cause of diseases classified elsewhere: Secondary | ICD-10-CM

## 2017-02-07 DIAGNOSIS — N898 Other specified noninflammatory disorders of vagina: Secondary | ICD-10-CM | POA: Diagnosis present

## 2017-02-07 DIAGNOSIS — Z3202 Encounter for pregnancy test, result negative: Secondary | ICD-10-CM | POA: Diagnosis not present

## 2017-02-07 DIAGNOSIS — R8299 Other abnormal findings in urine: Secondary | ICD-10-CM | POA: Diagnosis not present

## 2017-02-07 DIAGNOSIS — Z825 Family history of asthma and other chronic lower respiratory diseases: Secondary | ICD-10-CM | POA: Insufficient documentation

## 2017-02-07 DIAGNOSIS — Z8249 Family history of ischemic heart disease and other diseases of the circulatory system: Secondary | ICD-10-CM | POA: Diagnosis not present

## 2017-02-07 DIAGNOSIS — N760 Acute vaginitis: Secondary | ICD-10-CM | POA: Diagnosis not present

## 2017-02-07 LAB — POCT URINALYSIS DIP (DEVICE)
BILIRUBIN URINE: NEGATIVE
Glucose, UA: NEGATIVE mg/dL
HGB URINE DIPSTICK: NEGATIVE
Ketones, ur: NEGATIVE mg/dL
NITRITE: NEGATIVE
PH: 7 (ref 5.0–8.0)
Protein, ur: NEGATIVE mg/dL
SPECIFIC GRAVITY, URINE: 1.01 (ref 1.005–1.030)
UROBILINOGEN UA: 0.2 mg/dL (ref 0.0–1.0)

## 2017-02-07 LAB — POCT PREGNANCY, URINE: Preg Test, Ur: NEGATIVE

## 2017-02-07 MED ORDER — METRONIDAZOLE 500 MG PO TABS: 500.0000 mg | ORAL_TABLET | Freq: Two times a day (BID) | ORAL | 0 refills | Status: AC

## 2017-02-07 MED ORDER — FLUCONAZOLE 150 MG PO TABS: 150.0000 mg | ORAL_TABLET | Freq: Once | ORAL | 1 refills | Status: AC

## 2017-02-07 NOTE — ED Triage Notes (Signed)
PT reports a foul smelling vaginal discharge with intercourse. No itching, no pain, no dysuria.

## 2017-02-07 NOTE — ED Provider Notes (Addendum)
HPI  SUBJECTIVE:  Christina Jennings is a 26 y.o. female who presents with several days of vaginal odor, and odorous urine.  There are no aggravating or alleviating factors. She has not tried anything for this. She states that this is identical to previous episodes of BV. She denies vaginal discharge, itching. No dysuria, urgency, frequency, cloudy  urine, hematuria,  genital blisters, vaginal itching. No fevers, N/V, abd pain, pelvic pain,  back pain. No dyspareunia . Was on Flagyl one month ago for BV. Denies any other recent antibiotics., no perfumed soaps / bodywashes. Pt is in a longterm monogamous sexual relationship with a female  partner who is  asxatic.  STD's not a concern today. Similar sx before when had BV.  also has a history of chlamydia, yeast infections,  UTI. No h/o syphilis, herpes, HIV, gonorrhea, Trichomonas. No h/o PID, ectopic pregnancy. No h/o DM, pyelonephritis. LMP 8/26. She is status post a D&C for miscarriage in July but states that we can check a pregnancy test. PMD: None.   Past Medical History:  Diagnosis Date  . Chlamydia 2010  . Infection    UTI    Past Surgical History:  Procedure Laterality Date  . c-sx    . DILATION AND EVACUATION N/A 12/29/2016   Procedure: DILATATION AND EVACUATION;  Surgeon: Hermina Staggers, MD;  Location: [facility];  Service: Gynecology;  Laterality: N/A;  . NO PAST SURGERIES      Family History  Problem Relation Age of Onset  . Heart disease Mother   . Heart disease Father   . Asthma Son   . Diabetes Maternal Aunt     Social History  Substance Use Topics  . Smoking status: Never Smoker  . Smokeless tobacco: Never Used  . Alcohol use No    No current facility-administered medications for this encounter.   Current Outpatient Prescriptions:  .  docusate sodium (COLACE) 100 MG capsule, Take 1 capsule (100 mg total) by mouth 2 (two) times daily., Disp: 30 capsule, Rfl: 0 .  fluconazole (DIFLUCAN) 150 MG tablet, Take 1 tablet (150 mg  total) by mouth once. 1 tab po x 1. May repeat in 72 hours if no improvement, Disp: 2 tablet, Rfl: 1 .  ibuprofen (ADVIL,MOTRIN) 600 MG tablet, Take 1 tablet (600 mg total) by mouth every 6 (six) hours as needed., Disp: 30 tablet, Rfl: 0 .  metroNIDAZOLE (FLAGYL) 500 MG tablet, Take 1 tablet (500 mg total) by mouth 2 (two) times daily., Disp: 14 tablet, Rfl: 0  No Known Allergies   ROS  As noted in HPI.   Physical Exam  BP 105/75   Pulse 71   Temp 97.9 F (36.6 C) (Oral)   Resp 16   Ht 5' (1.524 m)   Wt 150 lb (68 kg)   LMP 02/01/2017   SpO2 99%   Breastfeeding? Unknown   BMI 29.29 kg/m   Constitutional: Well developed, well nourished, no acute distress Eyes:  EOMI, conjunctiva normal bilaterally HENT: Normocephalic, atraumatic,mucus membranes moist Respiratory: Normal inspiratory effort Cardiovascular: Normal rate GI: nondistended soft, nontender. No suprapubic tenderness  back: No CVA tenderness GU: External genitalia normal.  Normal vaginal mucosa.  Normal os. Thin oderous  yellowish  vaginal discharge.Uterus smooth, NT. No  CMT. No adnexal tenderness. No adnexal masses.  Chaperone present during exam skin: No rash, skin intact Musculoskeletal: no deformities Neurologic: Alert & oriented x 3, no focal neuro deficits Psychiatric: Speech and behavior appropriate   ED Course  Medications - No data to display  Orders Placed This Encounter  Procedures  . Pelvic exam    Standing Status:   Standing    Number of Occurrences:   1  . Urine culture    Standing Status:   Standing    Number of Occurrences:   1    Order Specific Question:   Patient immune status    Answer:   Normal  . POCT urinalysis dip (device)    Standing Status:   Standing    Number of Occurrences:   1  . Pregnancy, urine POC    Standing Status:   Standing    Number of Occurrences:   1    Results for orders placed or performed during the hospital encounter of 02/07/17 (from the past 24  hour(s))  POCT urinalysis dip (device)     Status: Abnormal   Collection Time: 02/07/17 10:56 AM  Result Value Ref Range   Glucose, UA NEGATIVE NEGATIVE mg/dL   Bilirubin Urine NEGATIVE NEGATIVE   Ketones, ur NEGATIVE NEGATIVE mg/dL   Specific Gravity, Urine 1.010 1.005 - 1.030   Hgb urine dipstick NEGATIVE NEGATIVE   pH 7.0 5.0 - 8.0   Protein, ur NEGATIVE NEGATIVE mg/dL   Urobilinogen, UA 0.2 0.0 - 1.0 mg/dL   Nitrite NEGATIVE NEGATIVE   Leukocytes, UA SMALL (A) NEGATIVE  Pregnancy, urine POC     Status: None   Collection Time: 02/07/17 11:09 AM  Result Value Ref Range   Preg Test, Ur NEGATIVE NEGATIVE   No results found.  ED Clinical Impression  BV (bacterial vaginosis)   ED Assessment/Plan  Urine pregnancy negative. Small leukocytes noted on UA, but patient has no urinary complaints. We'll send this off for culture to confirm absence of UTI. Not treating empirically today.  H&P most c/w  BV. Sent off GC/chlamydia, wet prep, UA, urine pregnancy. Patient declined testing for HIV, syphilis. She also declined empiric treatment for STDs because she is a long-term monogamous relationship and he is asymptomatic. Will send home with flagyl, and Diflucan for a yeast infection in case she gets one after finishing the Flagyl. States that she gets frequent yeast infections.  Advised pt to refrain from sexual contact until she knows lab results, symptoms resolve, and partner(s) are treated if necessary. Pt provided working phone number. Follow-up with PMD as needed. Discussed labs, MDM, plan and followup with patient. Pt agrees with plan.   Meds ordered this encounter  Medications  . metroNIDAZOLE (FLAGYL) 500 MG tablet    Sig: Take 1 tablet (500 mg total) by mouth 2 (two) times daily.    Dispense:  14 tablet    Refill:  0  . fluconazole (DIFLUCAN) 150 MG tablet    Sig: Take 1 tablet (150 mg total) by mouth once. 1 tab po x 1. May repeat in 72 hours if no improvement    Dispense:  2  tablet    Refill:  1    *This clinic note was created using Scientist, clinical (histocompatibility and immunogenetics). Therefore, there may be occasional mistakes despite careful proofreading.  ?    Domenick Gong, MD 02/07/17 1108    Domenick Gong, MD 02/07/17 [phone]

## 2017-02-07 NOTE — Discharge Instructions (Signed)
Take the medication as written. Give [place] a working phone number so that we can contact you if needed. Refrain from sexual contact until you know your results and your partner(s) are treated if necessary. Return to the ER if you get worse, have a fever >100.4, or for any concerns.    Below is a list of primary care practices who are taking new patients for you to follow-up with. [facility] [address] [place], [place] [address] [phone]  [facility] [phone] [address] [place] [address]  [facility]: [address] [place] [address]  [phone]  [facility]: [address] [place] [address]   [phone]  [facility]: [address] [place] [address]  [phone]  [place] [facility] : [address] [place] [address] [phone]  [facility]: [address] [place] [address] [phone]  [facility]: [address] [place] [address] [phone]  Dr. Oneal Grout [address] [facility] [place] [address]  [phone]  Dr. Jackie Plum, [facility]. [address] [place], [place] [address]  [phone]  Go to www.goodrx.com to look up your medications. This will give you a list of where you can find your prescriptions at the most affordable prices. Or ask the pharmacist what the cash price is, or if they have any other discount programs available to help make your medication more affordable. This can be less expensive than what you would pay with insurance.

## 2017-02-08 LAB — CERVICOVAGINAL ANCILLARY ONLY
Bacterial vaginitis: NEGATIVE
CANDIDA VAGINITIS: NEGATIVE
Chlamydia: NEGATIVE
NEISSERIA GONORRHEA: NEGATIVE
Trichomonas: NEGATIVE

## 2017-02-09 ENCOUNTER — Telehealth (HOSPITAL_COMMUNITY): Payer: Self-pay | Admitting: Internal Medicine

## 2017-02-09 LAB — URINE CULTURE
Culture: >=100000 — AB
Special Requests: NORMAL

## 2017-02-09 MED ORDER — CEPHALEXIN 500 MG PO CAPS: 500.0000 mg | ORAL_CAPSULE | Freq: Two times a day (BID) | ORAL | 0 refills | Status: AC

## 2017-02-09 NOTE — Telephone Encounter (Signed)
Clinical staff, please let patient know that urine culture was positive for E coli germ, sensitive to cephalexin.  Rx cephalexin sent to the pharmacy of record, [facility] on [address] [facility] [address].  Finish cephalexin.  Recheck for further evaluation if symptoms are not improving.  LM

## 2017-03-10 ENCOUNTER — Encounter (HOSPITAL_COMMUNITY): Payer: Self-pay | Admitting: *Deleted

## 2017-03-10 ENCOUNTER — Inpatient Hospital Stay (HOSPITAL_COMMUNITY)
Admission: AD | Admit: 2017-03-10 | Discharge: 2017-03-10 | Disposition: A | Discharge status: Home / Self Care | Payer: Medicaid Other | Source: Ambulatory Visit | Attending: Obstetrics & Gynecology | Admitting: Obstetrics & Gynecology

## 2017-03-10 ENCOUNTER — Inpatient Hospital Stay (HOSPITAL_COMMUNITY): Payer: Medicaid Other

## 2017-03-10 DIAGNOSIS — R109 Unspecified abdominal pain: Secondary | ICD-10-CM

## 2017-03-10 DIAGNOSIS — Z3A01 Less than 8 weeks gestation of pregnancy: Secondary | ICD-10-CM | POA: Insufficient documentation

## 2017-03-10 DIAGNOSIS — O9989 Other specified diseases and conditions complicating pregnancy, childbirth and the puerperium: Secondary | ICD-10-CM | POA: Diagnosis not present

## 2017-03-10 DIAGNOSIS — N939 Abnormal uterine and vaginal bleeding, unspecified: Secondary | ICD-10-CM | POA: Diagnosis present

## 2017-03-10 DIAGNOSIS — O99611 Diseases of the digestive system complicating pregnancy, first trimester: Secondary | ICD-10-CM | POA: Diagnosis not present

## 2017-03-10 DIAGNOSIS — O26891 Other specified pregnancy related conditions, first trimester: Secondary | ICD-10-CM | POA: Insufficient documentation

## 2017-03-10 DIAGNOSIS — O3680X0 Pregnancy with inconclusive fetal viability, not applicable or unspecified: Secondary | ICD-10-CM

## 2017-03-10 DIAGNOSIS — O209 Hemorrhage in early pregnancy, unspecified: Secondary | ICD-10-CM | POA: Diagnosis not present

## 2017-03-10 DIAGNOSIS — K5900 Constipation, unspecified: Secondary | ICD-10-CM | POA: Diagnosis not present

## 2017-03-10 DIAGNOSIS — O26899 Other specified pregnancy related conditions, unspecified trimester: Secondary | ICD-10-CM

## 2017-03-10 LAB — URINALYSIS, ROUTINE W REFLEX MICROSCOPIC
Bilirubin Urine: NEGATIVE
Glucose, UA: NEGATIVE mg/dL
Ketones, ur: NEGATIVE mg/dL
Nitrite: NEGATIVE
PH: 6 (ref 5.0–8.0)
Protein, ur: NEGATIVE mg/dL
SPECIFIC GRAVITY, URINE: 1.004 — AB (ref 1.005–1.030)

## 2017-03-10 LAB — WET PREP, GENITAL
CLUE CELLS WET PREP: NONE SEEN
Sperm: NONE SEEN
TRICH WET PREP: NONE SEEN
YEAST WET PREP: NONE SEEN

## 2017-03-10 LAB — POCT PREGNANCY, URINE: PREG TEST UR: POSITIVE — AB

## 2017-03-10 LAB — CBC
HEMATOCRIT: 37.8 % (ref 36.0–46.0)
HEMOGLOBIN: 12.5 g/dL (ref 12.0–15.0)
MCH: 27.2 pg (ref 26.0–34.0)
MCHC: 33.1 g/dL (ref 30.0–36.0)
MCV: 82.2 fL (ref 78.0–100.0)
Platelets: 247 10*3/uL (ref 150–400)
RBC: 4.6 MIL/uL (ref 3.87–5.11)
RDW: 15.1 % (ref 11.5–15.5)
WBC: 6.1 10*3/uL (ref 4.0–10.5)

## 2017-03-10 LAB — HCG, QUANTITATIVE, PREGNANCY: HCG, BETA CHAIN, QUANT, S: 48 m[IU]/mL — AB (ref <5)

## 2017-03-10 NOTE — MAU Note (Signed)
+  preg test at [facility] today, started bleeding right after.  Has been having cramps on the left side today.  Had a miscarriage this summer, so thought she needed to come get checked.

## 2017-03-10 NOTE — MAU Provider Note (Signed)
History     CSN: [id number]  Arrival date and time: 03/10/17 1840  First Provider Initiated Contact with Patient 03/10/17 1951      Chief Complaint  Patient presents with  . Vaginal Bleeding  . Abdominal Pain  . Possible Pregnancy   HPI Christina Jennings is a 26 y.o. G3P1011 at [redacted]w[redacted]d who presents with abdominal pain & vaginal bleeding. Symptoms began today. Reports bright red spotting on toilet paper every time she goes to the bathroom. Not bleeding into pad & not passing clots. Reports lower abdominal cramping that is worse in LLQ. Rates pain 3/10. Has not treated pain. Nothing makes better or worse. Last BM was sometime in the last week. States she normally has constipation & has a BM "every week or so". Does not treat constipation.   OB History    Gravida Para Term Preterm AB Living   0 1 1   SAB TAB Ectopic Multiple Live Births   1 0 0 0 1      Past Medical History:  Diagnosis Date  . Chlamydia 2010  . Infection    UTI    Past Surgical History:  Procedure Laterality Date  . c-sx    . DILATION AND EVACUATION N/A 12/29/2016   Procedure: DILATATION AND EVACUATION;  Surgeon: Hermina Staggers, MD;  Location: [facility];  Service: Gynecology;  Laterality: N/A;    Family History  Problem Relation Age of Onset  . Heart disease Mother   . Heart disease Father   . Asthma Son   . Diabetes Maternal Aunt     Social History  Substance Use Topics  . Smoking status: Never Smoker  . Smokeless tobacco: Never Used  . Alcohol use No    Allergies: No Known Allergies  Prescriptions Prior to Admission  Medication Sig Dispense Refill Last Dose  . docusate sodium (COLACE) 100 MG capsule Take 1 capsule (100 mg total) by mouth 2 (two) times daily. 30 capsule 0   . ibuprofen (ADVIL,MOTRIN) 600 MG tablet Take 1 tablet (600 mg total) by mouth every 6 (six) hours as needed. 30 tablet 0     Review of Systems  Constitutional: Negative.   Gastrointestinal: Positive for abdominal pain and  constipation. Negative for diarrhea, nausea and vomiting.  Genitourinary: Positive for vaginal bleeding. Negative for dysuria and vaginal discharge.   Physical Exam   Blood pressure 124/85, pulse 84, temperature 98.3 F (36.8 C), temperature source Oral, resp. rate 16, weight 158 lb 12 oz (72 kg), last menstrual period 02/01/2017, SpO2 100 %, unknown if currently breastfeeding.  Physical Exam  Nursing note and vitals reviewed. Constitutional: She is oriented to person, place, and time. She appears well-developed and well-nourished. No distress.  HENT:  Head: Normocephalic and atraumatic.  Eyes: Conjunctivae are normal. Right eye exhibits no discharge. Left eye exhibits no discharge. No scleral icterus.  Neck: Normal range of motion.  Respiratory: Effort normal. No respiratory distress.  GI: Soft. She exhibits mass (LLQ). There is no tenderness.  Genitourinary: Uterus normal. Cervix exhibits no motion tenderness and no friability. Right adnexum displays no mass and no tenderness. Left adnexum displays fullness. Left adnexum displays no mass. There is bleeding (small amount of dark red mucoid blood) in the vagina.  Genitourinary Comments: Cervix closed  Neurological: She is alert and oriented to person, place, and time.  Skin: Skin is warm and dry. She is not diaphoretic.  Psychiatric: She has a normal mood and affect. Her behavior is  normal. Judgment and thought content normal.    MAU Course  Procedures Results for orders placed or performed during the hospital encounter of 03/10/17 (from the past 24 hour(s))  Urinalysis, Routine w reflex microscopic     Status: Abnormal   Collection Time: 03/10/17  5:00 PM  Result Value Ref Range   Color, Urine YELLOW YELLOW   APPearance HAZY (A) CLEAR   Specific Gravity, Urine 1.004 (L) 1.005 - 1.030   pH 6.0 5.0 - 8.0   Glucose, UA NEGATIVE NEGATIVE mg/dL   Hgb urine dipstick LARGE (A) NEGATIVE   Bilirubin Urine NEGATIVE NEGATIVE   Ketones, ur  NEGATIVE NEGATIVE mg/dL   Protein, ur NEGATIVE NEGATIVE mg/dL   Nitrite NEGATIVE NEGATIVE   Leukocytes, UA TRACE (A) NEGATIVE   RBC / HPF 0-5 0 - 5 RBC/hpf   WBC, UA 0-5 0 - 5 WBC/hpf   Bacteria, UA RARE (A) NONE SEEN   Squamous Epithelial / LPF 6-30 (A) NONE SEEN   Mucus PRESENT   Pregnancy, urine POC     Status: None   Collection Time: 03/10/17  7:32 PM  Result Value Ref Range   Preg Test, Ur NEGATIVE NEGATIVE  Pregnancy, urine POC     Status: Abnormal   Collection Time: 03/10/17  7:33 PM  Result Value Ref Range   Preg Test, Ur POSITIVE (A) NEGATIVE  Wet prep, genital     Status: Abnormal   Collection Time: 03/10/17  7:58 PM  Result Value Ref Range   Yeast Wet Prep HPF POC NONE SEEN NONE SEEN   Trich, Wet Prep NONE SEEN NONE SEEN   Clue Cells Wet Prep HPF POC NONE SEEN NONE SEEN   WBC, Wet Prep HPF POC FEW (A) NONE SEEN   Sperm NONE SEEN   CBC     Status: None   Collection Time: 03/10/17  8:04 PM  Result Value Ref Range   WBC 6.1 4.0 - 10.5 K/uL   RBC 4.60 3.87 - 5.11 MIL/uL   Hemoglobin 12.5 12.0 - 15.0 g/dL   HCT 40.9 81.1 - 91.4 %   MCV 82.2 78.0 - 100.0 fL   MCH 27.2 26.0 - 34.0 pg   MCHC 33.1 30.0 - 36.0 g/dL   RDW 78.2 95.6 - 21.3 %   Platelets 247 150 - 400 K/uL  hCG, quantitative, pregnancy     Status: Abnormal   Collection Time: 03/10/17  8:04 PM  Result Value Ref Range   hCG, Beta Chain, Quant, S 48 (H) <5 mIU/mL   US Ob Comp Less 14 Wks  Result Date: 03/10/2017 CLINICAL DATA:  Vaginal bleeding in first trimester of pregnancy. EXAM: OBSTETRIC <14 WK [place] AND TRANSVAGINAL OB US TECHNIQUE: Both transabdominal and transvaginal ultrasound examinations were performed for complete evaluation of the gestation as well as the maternal uterus, adnexal regions, and pelvic cul-de-sac. Transvaginal technique was performed to assess early pregnancy. COMPARISON:  Ultrasound of December 22, 2016. FINDINGS: Intrauterine gestational sac: Not visualized. Yolk sac:  Not visualized.  Embryo:  Not visualized. Cardiac Activity: Not visualized. Subchorionic hemorrhage:  None visualized. Maternal uterus/adnexae: Ovaries are unremarkable. No free fluid is noted. IMPRESSION: No intrauterine gestational sac, yolk sac, fetal pole, or cardiac activity visualized. Differential considerations include intrauterine gestation too early to be sonographically visualized, spontaneous abortion, or ectopic pregnancy. Consider follow-up ultrasound in 14 days and serial quantitative beta HCG follow-up. Electronically Signed   By: Lupita Raider, M.D.   On: 03/10/2017 20:50   US  Ob Transvaginal  Result Date: 03/10/2017 CLINICAL DATA:  Vaginal bleeding in first trimester of pregnancy. EXAM: OBSTETRIC <14 WK [place] AND TRANSVAGINAL OB US TECHNIQUE: Both transabdominal and transvaginal ultrasound examinations were performed for complete evaluation of the gestation as well as the maternal uterus, adnexal regions, and pelvic cul-de-sac. Transvaginal technique was performed to assess early pregnancy. COMPARISON:  Ultrasound of December 22, 2016. FINDINGS: Intrauterine gestational sac: Not visualized. Yolk sac:  Not visualized. Embryo:  Not visualized. Cardiac Activity: Not visualized. Subchorionic hemorrhage:  None visualized. Maternal uterus/adnexae: Ovaries are unremarkable. No free fluid is noted. IMPRESSION: No intrauterine gestational sac, yolk sac, fetal pole, or cardiac activity visualized. Differential considerations include intrauterine gestation too early to be sonographically visualized, spontaneous abortion, or ectopic pregnancy. Consider follow-up ultrasound in 14 days and serial quantitative beta HCG follow-up. Electronically Signed   By: Lupita Raider, M.D.   On: 03/10/2017 20:50     MDM +UPT UA, wet prep, GC/chlamydia, CBC, ABO/Rh, quant hCG, HIV, and [place] today to rule out ectopic pregnancy A positive BHCG 48. Ultrasound shows no IUP or adnexal mass  This abdominal pain & vaginal bleeding could  represent a normal pregnancy, spontaneous abortion, or even an ectopic pregnancy which can be life-threatening. Cultures were obtained to rule out pelvic infection.    Assessment and Plan  A:  1. Pregnancy of unknown anatomic location   2. Vaginal bleeding in pregnancy, first trimester   3. Abdominal pain affecting pregnancy   4. Constipation during pregnancy in first trimester    P; Discharge home Discussed tx of constipation at home Go to [facility] Monday morning for [facility] Ectopic precautions Pelvic rest  Judeth Horn 03/10/2017, 7:50 PM

## 2017-03-10 NOTE — Discharge Instructions (Signed)
°

## 2017-03-13 ENCOUNTER — Ambulatory Visit: Payer: Medicaid Other | Admitting: General Practice

## 2017-03-13 ENCOUNTER — Encounter: Payer: Self-pay | Admitting: General Practice

## 2017-03-13 DIAGNOSIS — O3680X0 Pregnancy with inconclusive fetal viability, not applicable or unspecified: Secondary | ICD-10-CM

## 2017-03-13 LAB — POCT PREGNANCY, URINE: PREG TEST UR: POSITIVE — AB

## 2017-03-13 LAB — GC/CHLAMYDIA PROBE AMP (~~LOC~~) NOT AT ARMC
Chlamydia: NEGATIVE
NEISSERIA GONORRHEA: NEGATIVE

## 2017-03-13 LAB — HCG, QUANTITATIVE, PREGNANCY: hCG, Beta Chain, Quant, S: 5 m[IU]/mL — ABNORMAL HIGH (ref <5)

## 2017-03-13 NOTE — Progress Notes (Signed)
Patient here for stat bhcg today. Patient reports continued bleeding like a period over the past week. Discussed with patient having her wait for results & updated plan of care. Patient verbalized understanding & has no questions at this time. Reviewed results with Dr Marice Potter who agrees with decreasing bhcg levels indicating SAB. Patient needs no further follow up.  Informed patient of results & asked if she was interested in returning to the office to see a provider or discuss birth control. Patient declines. Advised she wait 2-3 normal menstrual cycles before trying to get pregnant again and should expect a period in the next 4-6 weeks. Patient verbalized understanding to all & had no questions

## 2017-06-13 NOTE — L&D Delivery Note (Deleted)
POSTPARTUM PROGRESS NOTE  Post Operative Day 1 Subjective:  Christina Jennings is a 27 y.o. [id number] 3353w2d s/p LTCS for arrest of descent.  No acute events overnight.  She states her legs feel unsteady when she gets up to walk, and she has tightness around her incision site. Pt denies problems with  voiding or po intake.  She denies nausea or vomiting.  Pain is moderately controlled.  She has had flatus. She has not had bowel movement.  Lochia Moderate.   Objective: Blood pressure 93/62, pulse 66, temperature 98.2 F (36.8 C), temperature source Oral, resp. rate 16, height 5' (1.524 m), weight 77.6 kg, last menstrual period 04/12/2017, SpO2 99 %, unknown if currently breastfeeding.  Physical Exam:  General: alert, cooperative and no distress Lochia:normal flow Chest: no respiratory distress Abdomen: soft, nontender,  Uterine Fundus: firm, appropriately tender DVT Evaluation: No calf swelling or tenderness Extremities: no edema  Recent Labs    01/19/18 1049 01/20/18 0527  HGB 8.9* 7.7*  HCT 25.9* 22.9*    Assessment/Plan:  ASSESSMENT: Christina Dandyyana Burkhead is a 27 y.o. [id number] 8853w2d s/p LTCS for arrest of descent. Patient had a Creatinine of 1.49 yesterday morning.  Will repeat CMET this AM.    Plan for discharge tomorrow, Breastfeeding and Contraception unsure   LOS: 3 days   Jackelyn KnifeDaniel K OlsonMD 01/20/2018, 8:33 AM

## 2017-06-29 ENCOUNTER — Ambulatory Visit (HOSPITAL_COMMUNITY)
Admission: EM | Admit: 2017-06-29 | Discharge: 2017-06-29 | Disposition: A | Discharge status: Home / Self Care | Payer: Medicaid Other | Attending: Internal Medicine | Admitting: Internal Medicine

## 2017-06-29 ENCOUNTER — Encounter (HOSPITAL_COMMUNITY): Payer: Self-pay | Admitting: Emergency Medicine

## 2017-06-29 DIAGNOSIS — K219 Gastro-esophageal reflux disease without esophagitis: Secondary | ICD-10-CM | POA: Diagnosis not present

## 2017-06-29 MED ORDER — RANITIDINE HCL 150 MG PO CAPS: 150.0000 mg | ORAL_CAPSULE | Freq: Every day | ORAL | 0 refills | Status: DC

## 2017-06-29 NOTE — ED Provider Notes (Signed)
[facility]    CSN: [id number] Arrival date & time: 06/29/17  1312     History   Chief Complaint No chief complaint on file.   HPI Christina Jennings is a 27 y.o. female.   Christina Jennings presents with complaints of epigastric pain which is constant but worse with laying flat and if belching. She is approximately [redacted] weeks pregnant. This is her 4th pregnancy with 1 living child and 2 previous miscarriages. Denies abdominal pain or vaginal discharge. without nausea or vomiting. Pain radiates to her back. She has been eating and drinking. States she has been constipated, last BM was yesterday. She takes a prenatal vitamin daily. Denies urinary or vaginal symptoms. Denies previous similar incident. Rates pain 5/10, is unable to describe quality. Has not taken any medications for her symptoms.     ROS per HPI.       Past Medical History:  Diagnosis Date  . Chlamydia 2010  . Infection    UTI    Patient Active Problem List   Diagnosis Date Noted  . Missed abortion 12/29/2016  . S/P dilatation and curettage 12/29/2016    Past Surgical History:  Procedure Laterality Date  . DILATION AND EVACUATION N/A 12/29/2016   Procedure: DILATATION AND EVACUATION;  Surgeon: Christina Staggers, MD;  Location: [facility];  Service: Gynecology;  Laterality: N/A;  . WISDOM TOOTH EXTRACTION  2016    OB History    Gravida Para Term Preterm AB Living   4 1 1  0 1 1   SAB TAB Ectopic Multiple Live Births   1 0 0 0 1       Home Medications    Prior to Admission medications   Medication Sig Start Date End Date Taking? Authorizing Provider  Prenatal Vit-Fe Fumarate-FA (PRENATAL MULTIVITAMIN) TABS tablet Take 1 tablet by mouth daily at 12 noon.   Yes [provider]  docusate sodium (COLACE) 100 MG capsule Take 1 capsule (100 mg total) by mouth 2 (two) times daily. 12/29/16   Jennings, Christina Comber, DO  ranitidine (ZANTAC) 150 MG capsule Take 1 capsule (150 mg total) by mouth daily.  06/29/17   Georgetta Haber, NP    Family History Family History  Problem Relation Age of Onset  . Heart disease Mother   . Heart disease Father   . Asthma Son   . Diabetes Maternal Aunt     Social History Social History   Tobacco Use  . Smoking status: Never Smoker  . Smokeless tobacco: Never Used  Substance Use Topics  . Alcohol use: No  . Drug use: No     Allergies   Patient has no known allergies.   Review of Systems Review of Systems   Physical Exam Triage Vital Signs ED Triage Vitals  Enc Vitals Group     BP 06/29/17 1359 122/87     Pulse Rate 06/29/17 1359 85     Resp 06/29/17 1359 16     Temp 06/29/17 1359 98.2 F (36.8 C)     Temp Source 06/29/17 1359 Oral     SpO2 06/29/17 1359 100 %     Weight 06/29/17 1359 155 lb (70.3 kg)     Height 06/29/17 1359 5' (1.524 m)     Head Circumference --      Peak Flow --      Pain Score 06/29/17 1400 5     Pain Loc --      Pain Edu? --  Excl. in GC? --    No data found.  Updated Vital Signs BP 122/87   Pulse 85   Temp 98.2 F (36.8 C) (Oral)   Resp 16   Ht 5' (1.524 m)   Wt 155 lb (70.3 kg)   LMP 04/12/2017   SpO2 100%   BMI 30.27 kg/m   Visual Acuity Right Eye Distance:   Left Eye Distance:   Bilateral Distance:    Right Eye Near:   Left Eye Near:    Bilateral Near:     Physical Exam  Constitutional: She is oriented to person, place, and time. She appears well-developed and well-nourished. No distress.  Cardiovascular: Normal rate, regular rhythm and normal heart sounds.  Pulmonary/Chest: Effort normal and breath sounds normal.  Abdominal: Soft. There is tenderness in the epigastric area. There is no rigidity, no rebound, no guarding, no CVA tenderness, no tenderness at McBurney's point and negative Murphy's sign.  Musculoskeletal:       Thoracic back: Normal. She exhibits no tenderness.  Neurological: She is alert and oriented to person, place, and time.  Skin: Skin is warm and dry.       UC Treatments / Results  Labs (all labs ordered are listed, but only abnormal results are displayed) Labs Reviewed - No data to display  EKG  EKG Interpretation None       Radiology No results found.  Procedures Procedures (including critical care time)  Medications Ordered in UC Medications - No data to display   Initial Impression / Assessment and Plan / UC Course  I have reviewed the triage vital signs and the nursing notes.  Pertinent labs & imaging results that were available during my care of the patient were reviewed by me and considered in my medical decision making (see chart for details).     Epigastric pain with history and physical consistent with reflux symptoms. Daily zantac recommended. Recommended to make first ob appointment. Daily stool softener may help with constipation. Continue with daily pre natal. Return precautions provided. Patient verbalized understanding and agreeable to plan.    Final Clinical Impressions(s) / UC Diagnoses   Final diagnoses:  Gastroesophageal reflux disease, esophagitis presence not specified    ED Discharge Orders        Ordered    ranitidine (ZANTAC) 150 MG capsule  Daily     06/29/17 1514       Controlled Substance Prescriptions [place] Controlled Substance Registry consulted? Not Applicable   Georgetta HaberBurky, Christina B, NP 06/29/17 1523

## 2017-06-29 NOTE — ED Triage Notes (Addendum)
PT reports epigastric pain that is worse when she belches or hiccups. PT reports it is also worse when laying down. PT denies vaginal discharge and / or bleeding.   PT reports she is 2.5 months pregnant.

## 2017-06-29 NOTE — Discharge Instructions (Signed)
Please start daily zantac as prescribed. May increase to twice a day if needed. Daily stool softener to help with constipation. Please call OB for first appointment. If worsening of symptoms, increased pain, chest pain, palpitations, fever, vomiting, please go to er.

## 2017-07-07 ENCOUNTER — Encounter: Payer: Self-pay | Admitting: Family Medicine

## 2017-07-07 ENCOUNTER — Ambulatory Visit: Payer: Medicaid Other | Admitting: General Practice

## 2017-07-07 DIAGNOSIS — Z3201 Encounter for pregnancy test, result positive: Secondary | ICD-10-CM

## 2017-07-07 LAB — POCT PREGNANCY, URINE: PREG TEST UR: POSITIVE — AB

## 2017-07-07 NOTE — Progress Notes (Signed)
Patient here for upt today. UPT +. Patient reports first positive home test December 7. LMP 04/12/17 EDD 01/17/18 7226w2d. Patient denies taking any meds only PNV. Patient requests to hear FHR. FHR 152. Recommended she begin prenatal care.  Patient verbalized understanding & had no questions

## 2017-07-23 ENCOUNTER — Encounter (HOSPITAL_COMMUNITY): Payer: Self-pay

## 2017-07-23 ENCOUNTER — Inpatient Hospital Stay (HOSPITAL_COMMUNITY)
Admission: AD | Admit: 2017-07-23 | Discharge: 2017-07-23 | Disposition: A | Discharge status: Home / Self Care | Payer: Medicaid Other | Source: Ambulatory Visit | Attending: Obstetrics & Gynecology | Admitting: Obstetrics & Gynecology

## 2017-07-23 DIAGNOSIS — R102 Pelvic and perineal pain: Secondary | ICD-10-CM | POA: Diagnosis not present

## 2017-07-23 DIAGNOSIS — O26892 Other specified pregnancy related conditions, second trimester: Secondary | ICD-10-CM | POA: Insufficient documentation

## 2017-07-23 DIAGNOSIS — R109 Unspecified abdominal pain: Secondary | ICD-10-CM

## 2017-07-23 DIAGNOSIS — Z8619 Personal history of other infectious and parasitic diseases: Secondary | ICD-10-CM | POA: Insufficient documentation

## 2017-07-23 DIAGNOSIS — Z3A14 14 weeks gestation of pregnancy: Secondary | ICD-10-CM | POA: Insufficient documentation

## 2017-07-23 DIAGNOSIS — O26899 Other specified pregnancy related conditions, unspecified trimester: Secondary | ICD-10-CM

## 2017-07-23 DIAGNOSIS — Z87440 Personal history of urinary (tract) infections: Secondary | ICD-10-CM | POA: Diagnosis not present

## 2017-07-23 LAB — URINALYSIS, ROUTINE W REFLEX MICROSCOPIC
BILIRUBIN URINE: NEGATIVE
GLUCOSE, UA: NEGATIVE mg/dL
Hgb urine dipstick: NEGATIVE
Ketones, ur: NEGATIVE mg/dL
Nitrite: NEGATIVE
PH: 6 (ref 5.0–8.0)
Protein, ur: NEGATIVE mg/dL
SPECIFIC GRAVITY, URINE: 1.024 (ref 1.005–1.030)

## 2017-07-23 LAB — WET PREP, GENITAL
Clue Cells Wet Prep HPF POC: NONE SEEN
SPERM: NONE SEEN
TRICH WET PREP: NONE SEEN
Yeast Wet Prep HPF POC: NONE SEEN

## 2017-07-23 NOTE — MAU Note (Signed)
Pt is a G1P0 at 14.4 weeks c/o cramping for 2 days.  No VB, FHR doppler 140-150 BPM on admission.

## 2017-07-23 NOTE — MAU Provider Note (Signed)
Chief Complaint: Abdominal Pain   None     SUBJECTIVE HPI: Christina Jennings is a 27 y.o. [id number] at [redacted]w[redacted]d by LMP who presents to maternity admissions reporting abdominal cramping starting yesterday. Her pain is worse when she rolls over in bed or gets out of bed.  Any sudden change of position causes the pain. It is sharp and cramping pain low bilaterally in her abdomen. It does not radiate.  She has not tried any treatments.  She had a miscarriage at 13 weeks before so was worried about this. She has not yet started care but wants to be seen at [facility]. There are no other associated symptoms. She denies vaginal bleeding, vaginal itching/burning, urinary symptoms, h/a, dizziness, n/v, or fever/chills.     HPI  Past Medical History:  Diagnosis Date  . Chlamydia 2010  . Infection    UTI   Past Surgical History:  Procedure Laterality Date  . DILATION AND EVACUATION N/A 12/29/2016   Procedure: DILATATION AND EVACUATION;  Surgeon: Hermina Staggers, MD;  Location: [facility];  Service: Gynecology;  Laterality: N/A;  . WISDOM TOOTH EXTRACTION  2016   Social History   Socioeconomic History  . Marital status: Single    Spouse name: Not on file  . Number of children: Not on file  . Years of education: Not on file  . Highest education level: Not on file  Social Needs  . Financial resource strain: Not on file  . Food insecurity - worry: Not on file  . Food insecurity - inability: Not on file  . Transportation needs - medical: Not on file  . Transportation needs - non-medical: Not on file  Occupational History  . Not on file  Tobacco Use  . Smoking status: Never Smoker  . Smokeless tobacco: Never Used  Substance and Sexual Activity  . Alcohol use: No  . Drug use: No  . Sexual activity: Yes  Other Topics Concern  . Not on file  Social History Narrative  . Not on file   No current facility-administered medications on file prior to encounter.    Current Outpatient Medications on File  Prior to Encounter  Medication Sig Dispense Refill  . Prenatal Vit-Fe Fumarate-FA (PRENATAL MULTIVITAMIN) TABS tablet Take 1 tablet by mouth daily at 12 noon.    . ranitidine (ZANTAC) 150 MG capsule Take 1 capsule (150 mg total) by mouth daily. (Patient not taking: Reported on 07/23/2017) 30 capsule 0   No Known Allergies  ROS:  Review of Systems  Constitutional: Negative for chills, fatigue and fever.  Respiratory: Negative for shortness of breath.   Cardiovascular: Negative for chest pain.  Gastrointestinal: Positive for abdominal pain. Negative for nausea and vomiting.  Genitourinary: Positive for pelvic pain. Negative for difficulty urinating, dysuria, flank pain, vaginal bleeding, vaginal discharge and vaginal pain.  Musculoskeletal: Negative for back pain.  Neurological: Negative for dizziness and headaches.  Psychiatric/Behavioral: Negative.      I have reviewed patient's Past Medical Hx, Surgical Hx, Family Hx, Social Hx, medications and allergies.   Physical Exam   Patient Vitals for the past 24 hrs:  Temp Temp src Resp Height Weight  07/23/17 0942 98.5 F (36.9 C) Oral 17 - -  07/23/17 0923 - - - 5' (1.524 m) 159 lb (72.1 kg)   Constitutional: Well-developed, well-nourished female in no acute distress.  Cardiovascular: normal rate Respiratory: normal effort GI: Abd soft, non-tender. Pos BS x 4 MS: Extremities nontender, no edema, normal ROM Neurologic: Alert  and oriented x 4.  GU: Neg CVAT.  PELVIC EXAM: Cervix pink, visually closed, without lesion, scant white creamy discharge, vaginal walls and external genitalia normal Bimanual exam: Cervix 0/long/high, firm, anterior, neg CMT, uterus nontender, nonenlarged, adnexa without tenderness, enlargement, or mass  FHT 150 by doppler  LAB RESULTS Results for orders placed or performed during the hospital encounter of 07/23/17 (from the past 24 hour(s))  Urinalysis, Routine w reflex microscopic     Status: Abnormal    Collection Time: 07/23/17  9:17 AM  Result Value Ref Range   Color, Urine YELLOW YELLOW   APPearance CLOUDY (A) CLEAR   Specific Gravity, Urine 1.024 1.005 - 1.030   pH 6.0 5.0 - 8.0   Glucose, UA NEGATIVE NEGATIVE mg/dL   Hgb urine dipstick NEGATIVE NEGATIVE   Bilirubin Urine NEGATIVE NEGATIVE   Ketones, ur NEGATIVE NEGATIVE mg/dL   Protein, ur NEGATIVE NEGATIVE mg/dL   Nitrite NEGATIVE NEGATIVE   Leukocytes, UA SMALL (A) NEGATIVE   RBC / HPF 0-5 0 - 5 RBC/hpf   WBC, UA 6-30 0 - 5 WBC/hpf   Bacteria, UA MANY (A) NONE SEEN   Squamous Epithelial / LPF TOO NUMEROUS TO COUNT (A) NONE SEEN   Mucus PRESENT     --/--/A POS (06/20 1657)  IMAGING Limited OB US Date: 07/23/17 EDD : 01/17/18  based on sure LMP Viability:  FHT detected CRL measurement c/w LMP dating Amniotic fluid subjectively normal No abnormalities identified   MAU Management/MDM: Bedside US reveals normal IUP with CRL c/w LMP dates.  Urine with small leuks but not a clean catch so sent for culture. Wet prep/GCC pending.  Pain is most c/w round ligament pain.  Rest/ice/heat/warm bath/Tylenol for pain.  [facility] but does not have appointment.  Message sent to establish care.  Outpatient anatomy US ordered.  Return precautions reviewed.  Pt discharged with strict second trimester/abdominal pain precautions.  ASSESSMENT 1. Pain of round ligament affecting pregnancy, antepartum   2. Abdominal pain during pregnancy in second trimester     PLAN Discharge home Allergies as of 07/23/2017   No Known Allergies     Medication List    TAKE these medications   prenatal multivitamin Tabs tablet Take 1 tablet by mouth daily at 12 noon.   ranitidine 150 MG capsule Commonly known as:  ZANTAC Take 1 capsule (150 mg total) by mouth daily.      Follow-up Information    [facility].   Specialty:  Obstetrics and Gynecology Why:  The office will call you  to set up appointment. Return to MAU as needed for emergencies. Contact information: [address] [place] [id number] [phone]          Sharen CounterLisa Leftwich-Kirby Certified Nurse-Midwife 07/23/2017  11:19 AM

## 2017-07-23 NOTE — MAU Note (Signed)
Patient presents with lower abdominal cramping x 2 days, denies vaginal bleeding. No N/V/D

## 2017-07-24 LAB — GC/CHLAMYDIA PROBE AMP (~~LOC~~) NOT AT ARMC
CHLAMYDIA, DNA PROBE: NEGATIVE
Neisseria Gonorrhea: NEGATIVE

## 2017-07-25 LAB — CULTURE, OB URINE: Culture: >=100000 — AB

## 2017-07-26 ENCOUNTER — Telehealth: Payer: Self-pay | Admitting: Advanced Practice Midwife

## 2017-07-26 MED ORDER — SULFAMETHOXAZOLE-TRIMETHOPRIM 800-160 MG PO TABS: 1.0000 | ORAL_TABLET | Freq: Two times a day (BID) | ORAL | 0 refills | Status: AC

## 2017-07-26 NOTE — Telephone Encounter (Signed)
Pt with E coli UTI from MAU visit 07/23/17. Rx for Bactrim DS BID x 7 days sent to pt pharmacy.  Left message for pt to return call regarding labs.

## 2017-08-09 ENCOUNTER — Telehealth: Payer: Self-pay | Admitting: Certified Nurse Midwife

## 2017-08-09 NOTE — Telephone Encounter (Signed)
Pt called stating she had a message to call back from 2 weeks ago. Notified pt of +UTI and to pick up abx called into pharmacy. Pt verbalized understanding.

## 2017-08-30 ENCOUNTER — Encounter: Payer: Self-pay | Admitting: Advanced Practice Midwife

## 2017-08-30 ENCOUNTER — Ambulatory Visit (INDEPENDENT_AMBULATORY_CARE_PROVIDER_SITE_OTHER): Payer: Medicaid Other | Admitting: Advanced Practice Midwife

## 2017-08-30 ENCOUNTER — Other Ambulatory Visit (HOSPITAL_COMMUNITY)
Admission: RE | Admit: 2017-08-30 | Discharge: 2017-08-30 | Disposition: A | Discharge status: Home / Self Care | Payer: Medicaid Other | Source: Ambulatory Visit | Attending: Advanced Practice Midwife | Admitting: Advanced Practice Midwife

## 2017-08-30 DIAGNOSIS — Z3480 Encounter for supervision of other normal pregnancy, unspecified trimester: Secondary | ICD-10-CM | POA: Insufficient documentation

## 2017-08-30 DIAGNOSIS — Z3482 Encounter for supervision of other normal pregnancy, second trimester: Secondary | ICD-10-CM

## 2017-08-30 LAB — POCT URINALYSIS DIP (DEVICE)
Bilirubin Urine: NEGATIVE
Glucose, UA: NEGATIVE mg/dL
HGB URINE DIPSTICK: NEGATIVE
Ketones, ur: NEGATIVE mg/dL
Leukocytes, UA: NEGATIVE
NITRITE: NEGATIVE
PH: 7 (ref 5.0–8.0)
Protein, ur: NEGATIVE mg/dL
Specific Gravity, Urine: 1.02 (ref 1.005–1.030)
Urobilinogen, UA: 0.2 mg/dL (ref 0.0–1.0)

## 2017-08-30 NOTE — Patient Instructions (Signed)
AREA PEDIATRIC/FAMILY PRACTICE PHYSICIANS  [facility] [address], [address] [place], [place]  [address] Phone - [phone]   Fax - [phone]  [facility] [address] [place], [place] [address] Phone - [phone]   Fax - [phone]  [facility] AM[facility] [address] [place], [place]  [address] Phone - [phone]   Fax - [phone]  [facility] [address] [place], [place]  [address] Phone - [phone]   Fax - [phone]  [facility] [address] [place], [place]  [address] Phone - [phone]   Fax - [phone]  [facility] [address] [place], [place]  [address] Phone - [phone]   Fax - [phone]  [facility] [address] [place], [place]  [address] Phone - [phone]   Fax - [phone]  [facility] [address], [address] [place], [place]  [address] Phone - [phone]   Fax - [phone]  [facility] [address] [place], [place]  [address] Phone - [phone]   Fax - [phone] [facility] [address] [place], [place]  [address] Phone - [phone]   Fax - [phone]  [facility] [address] [place], [place]  [address] Phone - [phone]   Fax - [phone]  [facility] [address] [place], [place]  [address] Phone - [phone]   Fax - [phone]  [facility] [address], [address] [place], [place]  [address] Phone - [phone]   Fax - [phone]  SHILPA GOSRANI [address] [place], [place]  [address] Phone - [phone]  [facility] [address] [place], [place]  [address] Phone - [phone]   Fax - [phone]  [facility] [address] [place], [place]  [address] Phone - [phone]   Fax - [phone]  [facility] [address] [place], [place]  [address] Phone - [phone]   Fax - [phone]  [facility] [address] [place], [place]  [address] Phone - [phone]   Fax - [phone]   [facility] [address] [place], [place]  [address] Phone - [phone]   Fax - [phone]  [facility] [address] [place], [place]  [address] Phone - [phone]   Fax - [phone]  DAVID RUBIN [address] [place], [place]  [address] Phone - [phone]   Fax - [phone]  [facility] [address] [place], [place]  [address] Phone - [phone]   Fax - [phone]  [facility] [address] [place], [place]  [address] Phone - [phone]   Fax - [phone] [facility] [address] [place], [place]  [address] Phone - [phone]   Fax - [phone]  [facility] [address] [place], [place]  [address] Phone - [phone]   Fax - [phone]  [facility] [address] [place], [place]  [address] Phone - [phone]   Fax - [phone]  [facility] Charlene Flemming MD [address] [place] [address] Phone [phone]  Fax [phone]  Childbirth Education Options: [facility] Classes:  Childbirth education classes can help you   get ready for a positive parenting experience. You can also meet other expectant parents and get free stuff for your baby. Each class runs for five weeks on the same night and costs $45 for the mother-to-be and her support person. Medicaid covers the cost if you are eligible. Call [phone] to register. [facility] Childbirth Education:  [phone] or [phone] or  [email or web address]  Baby & Me Class: Discuss newborn & infant parenting and family adjustment issues with other new mothers in a relaxed environment. Each week brings a new speaker or baby-centered activity. We encourage new mothers to join us every Thursday at 11:00am. Babies birth until crawling. No registration or fee. Daddy Boot Camp: This course offers Dads-to-be the tools and knowledge needed to feel confident on their journey to becoming new fathers. Experienced dads, who have been trained as coaches, teach dads-to-be how to hold, comfort, diaper, swaddle and play with their infant while being able to support the new mom as well. A class for men taught by men. $25/dad Big Brother/Big Sister: Let your children share in the joy of a new brother or sister in this special class designed just for them. Class includes discussion about how families care for babies: swaddling, holding, diapering, safety as well as how they can be helpful in their new role. This class is designed for children ages 2 to 6, but any age is welcome. Please register each child individually. $5/child  Mom Talk: This mom-led group offers support and connection to mothers as they journey through the adjustments and struggles of that sometimes overwhelming first year after the birth of a child. Tuesdays at 10:00am and Thursdays at 6:00pm. Babies welcome. No registration or fee. Breastfeeding Support Group: This group is a mother-to-mother support circle where moms have the opportunity to share their breastfeeding experiences. A Lactation Consultant is present for questions and concerns. Meets each Tuesday at 11:00am. No fee or registration. Breastfeeding Your Baby: Learn what to expect in the first days of breastfeeding your newborn.  This class will help you feel more confident with the skills needed to begin your breastfeeding experience. Many new mothers are concerned about breastfeeding after leaving the hospital. This class  will also address the most common fears and challenges about breastfeeding during the first few weeks, months and beyond. (call for fee) Comfort Techniques and Tour: This 2 hour interactive class will provide you the opportunity to learn & practice hands-on techniques that can help relieve some of the discomfort of labor and encourage your baby to rotate toward the best position for birth. You and your partner will be able to try a variety of labor positions with birth balls and rebozos as well as practice breathing, relaxation, and visualization techniques. A tour of the [facility] is included with this class. $20 per registrant and support person Childbirth Class- Weekend Option: This class is a Weekend version of our Birth & Baby series. It is designed for parents who have a difficult time fitting several weeks of classes into their schedule. It covers the care of your newborn and the basics of labor and childbirth. It also includes a Maternity Care Center Tour of [facility] and lunch. The class is held two consecutive days: beginning on Friday evening from 6:30 - 8:30 p.m. and the next day, Saturday from 9 a.m. - 4 p.m. (call for fee) Waterbirth Class: Interested in a waterbirth?  This informational class will help you discover whether waterbirth is the right fit for you.   Education about waterbirth itself, supplies you would need and how to assemble your support team is what you can expect from this class. Some obstetrical practices require this class in order to pursue a waterbirth. (Not all obstetrical practices offer waterbirth-check with your healthcare provider.) Register only the expectant mom, but you are encouraged to bring your partner to class! Required if planning waterbirth, no fee. Infant/Child CPR: Parents, grandparents, babysitters, and friends learn Cardio-Pulmonary Resuscitation skills for infants and children. You will also learn how to treat both conscious  and unconscious choking in infants and children. This Family & Friends program does not offer certification. Register each participant individually to ensure that enough mannequins are available. (Call for fee) Grandparent Love: Expecting a grandbaby? This class is for you! Learn about the latest infant care and safety recommendations and ways to support your own child as he or she transitions into the parenting role. Taught by Registered Nurses who are childbirth instructors, but most importantly...they are grandmothers too! $10/person. Childbirth Class- Natural Childbirth: This series of 5 weekly classes is for expectant parents who want to learn and practice natural methods of coping with the process of labor and childbirth. Relaxation, breathing, massage, visualization, role of the partner, and helpful positioning are highlighted. Participants learn how to be confident in their body's ability to give birth. This class will empower and help parents make informed decisions about their own care. Includes discussion that will help new parents transition into the immediate postpartum period. [facility] is included. We suggest taking this class between 25-32 weeks, but it's only a recommendation. $75 per registrant and one support person or $30 Medicaid. Childbirth Class- 3 week Series: This option of 3 weekly classes helps you and your labor partner prepare for childbirth. Newborn care, labor & birth, cesarean birth, pain management, and comfort techniques are discussed and a [place] [facility] is included. The class meets at the same time, on the same day of the week for 3 consecutive weeks beginning with the starting date you choose. $60 for registrant and one support person.  Marvelous Multiples: Expecting twins, triplets, or more? This class covers the differences in labor, birth, parenting, and breastfeeding issues that face multiples' parents.  NICU tour is included. Led by a Certified Childbirth Educator who is the mother of twins. No fee. Caring for Baby: This class is for expectant and adoptive parents who want to learn and practice the most up-to-date newborn care for their babies. Focus is on birth through the first six weeks of life. Topics include feeding, bathing, diapering, crying, umbilical cord care, circumcision care and safe sleep. Parents learn to recognize symptoms of illness and when to call the pediatrician. Register only the mom-to-be and your partner or support person can plan to come with you! $10 per registrant and support person Childbirth Class- online option: This online class offers you the freedom to complete a Birth and Baby series in the comfort of your own home. The flexibility of this option allows you to review sections at your own pace, at times convenient to you and your support people. It includes additional video information, animations, quizzes, and extended activities. Get organized with helpful eClass tools, checklists, and trackers. Once you register online for the class, you will receive an email within a few days to accept the invitation and begin the class when the time is right for you. The content will be available to you for 60 days. $  60 for 60 days of online access for you and your support people.  Local Doulas: Natural Baby Doulas [email or web address] Tel: [phone] [email or web address] [facility] [phone] [email or web address] The Labor Ladies  (also do waterbirth tub rental) [phone] [email or web address] [facility] [phone] [email or web address] Sacred Rhythms  [phone] [email or web address] [facility] [employer] (PADA) [email or web address] http://www.padanc.org/index.htm [facility]   [email or web address] Considering Waterbirth? Guide for patients at [facility]  Why consider waterbirth?  . Gentle birth for babies . Less pain medicine used in labor . May allow for passive descent/less pushing . May reduce perineal tears  . More mobility and instinctive maternal position changes . Increased maternal relaxation . Reduced blood pressure in labor  Is waterbirth safe? What are the risks of infection, drowning or other complications?  . Infection: o Very low risk (3.7 % for tub vs 4.8% for bed) o 7 in 8000 waterbirths with documented infection o Poorly cleaned equipment most common cause o Slightly lower group B strep transmission rate  . Drowning o Maternal:  - Very low risk   - Related to seizures or fainting o Newborn:  - Very low risk. No evidence of increased risk of respiratory problems in multiple large studies - Physiological protection from breathing under water - Avoid underwater birth if there are any fetal complications - Once baby's head is out of the water, keep it out.  . Birth complication o Some reports of cord trauma, but risk decreased by bringing baby to surface gradually o No evidence of increased risk of shoulder dystocia. Mothers can usually change positions faster in water than in a bed, possibly aiding the maneuvers to free the shoulder.   You must attend a Waterbirth class at [facility]  3rd Wednesday of every month from 7-9pm  Free  Register by calling [phone] or online at [email or web address]  Bring us the certificate from the class to your prenatal appointment  Meet with a midwife at 36 weeks to see if you can still plan a waterbirth and to sign the consent.   Purchase or rent the following supplies:   Water Birth Pool (Birth Pool in a Box or [place] for instance)  (Tubs start ~$125)  Single-use disposable tub liner designed for your brand of tub  New garden hose labeled  "lead-free", "suitable for drinking water",  Electric drain pump to remove water (We recommend 792 gallon per hour or greater pump.)   Separate garden hose to remove the dirty water  Fish net  Bathing suit top (optional)  Long-handled mirror (optional)  Places to purchase or rent supplies  Yourwaterbirth.com for tub purchases and supplies  Waterbirthsolutions.com for tub purchases and supplies  The Labor Ladies (www.[email or web address]) $275 for tub rental/set-up & take down/kit   [facility] [employer] (http://www.padanc.org/MeetUs.htm) Information regarding doulas (labor support) who provide pool rentals  Our practice has a Birth Pool in a Box tub at the hospital that you may borrow on a first-come-first-served basis. It is your responsibility to to set up, clean and break down the tub. We cannot guarantee the availability of this tub in advance. You are responsible for bringing all accessories listed above. If you do not have all necessary supplies you cannot have a waterbirth.    Things that would prevent you from having a waterbirth:  Premature, <37wks  Previous cesarean birth  Presence of thick meconium-stained fluid  Multiple gestation (Twins, triplets, etc.)  Uncontrolled diabetes or gestational diabetes requiring medication  Hypertension requiring medication   or diagnosis of pre-eclampsia  Heavy vaginal bleeding  Non-reassuring fetal heart rate  Active infection (MRSA, etc.). Group B Strep is NOT a contraindication for  waterbirth.  If your labor has to be induced and induction method requires continuous  monitoring of the baby's heart rate  Other risks/issues identified by your obstetrical provider  Please remember that birth is unpredictable. Under certain unforeseeable circumstances your provider may advise against giving birth in the tub. These decisions will be made on a case-by-case basis and with the safety of you and your baby as our highest  priority.      

## 2017-08-30 NOTE — Progress Notes (Signed)
Pt went to MAU @ 14 wks due to round ligament pain.

## 2017-08-30 NOTE — Progress Notes (Signed)
  Subjective:    Christina Jennings is being seen today for her first obstetrical visit.  This is a planned pregnancy. She is at 3120w0d gestation. Her obstetrical history is significant for none. Relationship with FOB: significant other, living together. Patient undecided  intend to breast feed. Pregnancy history fully reviewed.  Patient reports no complaints.  Review of Systems:   Review of Systems  All other systems reviewed and are negative.   Objective:     BP 110/66   Pulse 88   Wt 163 lb 3.2 oz (74 kg)   LMP 04/12/2017 (Exact Date)   BMI 31.87 kg/m  Physical Exam  Nursing note and vitals reviewed. Constitutional: She is oriented to person, place, and time. She appears well-developed and well-nourished. No distress.  HENT:  Head: Normocephalic.  Cardiovascular: Normal rate.  Respiratory: Effort normal. Right breast exhibits no inverted nipple, no mass and no nipple discharge. Left breast exhibits no inverted nipple, no mass and no nipple discharge.  GI: Soft. There is no tenderness. There is no rebound.  Genitourinary:  Genitourinary Comments:  External: no lesion Vagina: small amount of white discharge Cervix: pink, smooth, no CMT Uterus: 20 weeks size    Neurological: She is alert and oriented to person, place, and time.  Skin: Skin is warm and dry.  Psychiatric: She has a normal mood and affect.    Maternal Exam:  Abdomen: Fundal height is 20.       Fetal Exam Fetal Monitor Review: Mode: hand-held doppler probe.   Baseline rate: 148.      Assessment:    Pregnancy: [id number] Patient Active Problem List   Diagnosis Date Noted  . Supervision of other normal pregnancy, antepartum 08/30/2017  . Missed abortion 12/29/2016  . S/P dilatation and curettage 12/29/2016       Plan:     Initial labs drawn. Prenatal vitamins. Problem list reviewed and updated. Patient desires panorama only  AFP3 discussed: declined. Role of ultrasound in pregnancy discussed;  fetal survey: requested. Amniocentesis discussed: not indicated. Follow up in 4 weeks. 50% of 45 min visit spent on counseling and coordination of care.     Thressa ShellerHeather Hogan 08/30/2017

## 2017-08-31 LAB — CYTOLOGY - PAP
CHLAMYDIA, DNA PROBE: NEGATIVE
Diagnosis: NEGATIVE
NEISSERIA GONORRHEA: NEGATIVE

## 2017-09-01 LAB — OBSTETRIC PANEL, INCLUDING HIV
Antibody Screen: NEGATIVE
BASOS ABS: 0 10*3/uL (ref 0.0–0.2)
Basos: 0 %
EOS (ABSOLUTE): 0 10*3/uL (ref 0.0–0.4)
Eos: 0 %
HEP B S AG: NEGATIVE
HIV SCREEN 4TH GENERATION: NONREACTIVE
Hematocrit: 29.8 % — ABNORMAL LOW (ref 34.0–46.6)
Hemoglobin: 9.8 g/dL — ABNORMAL LOW (ref 11.1–15.9)
Immature Grans (Abs): 0.1 10*3/uL (ref 0.0–0.1)
Immature Granulocytes: 1 %
LYMPHS ABS: 1.8 10*3/uL (ref 0.7–3.1)
Lymphs: 18 %
MCH: 27 pg (ref 26.6–33.0)
MCHC: 32.9 g/dL (ref 31.5–35.7)
MCV: 82 fL (ref 79–97)
MONOS ABS: 0.9 10*3/uL (ref 0.1–0.9)
Monocytes: 10 %
NEUTROS ABS: 6.9 10*3/uL (ref 1.4–7.0)
Neutrophils: 71 %
PLATELETS: 218 10*3/uL (ref 150–379)
RBC: 3.63 x10E6/uL — AB (ref 3.77–5.28)
RDW: 15 % (ref 12.3–15.4)
RPR Ser Ql: NONREACTIVE
Rh Factor: POSITIVE
Rubella Antibodies, IGG: 5.39 index (ref >0.99)
WBC: 9.7 10*3/uL (ref 3.4–10.8)

## 2017-09-01 LAB — HEMOGLOBINOPATHY EVALUATION
Ferritin: 27 ng/mL (ref 15–150)
HGB A2 QUANT: 2.2 % (ref 1.8–3.2)
HGB A: 97.8 % (ref 96.4–98.8)
HGB C: 0 %
HGB S: 0 %
HGB VARIANT: 0 %
Hgb F Quant: 0 % (ref 0.0–2.0)
Hgb Solubility: NEGATIVE

## 2017-09-01 LAB — CULTURE, OB URINE

## 2017-09-01 LAB — URINE CULTURE, OB REFLEX

## 2017-09-05 ENCOUNTER — Ambulatory Visit (HOSPITAL_COMMUNITY)
Admission: RE | Admit: 2017-09-05 | Discharge: 2017-09-05 | Disposition: A | Discharge status: Home / Self Care | Payer: Medicaid Other | Source: Ambulatory Visit | Attending: Advanced Practice Midwife | Admitting: Advanced Practice Midwife

## 2017-09-05 ENCOUNTER — Other Ambulatory Visit: Payer: Self-pay | Admitting: Advanced Practice Midwife

## 2017-09-05 ENCOUNTER — Encounter: Payer: Self-pay | Admitting: *Deleted

## 2017-09-05 DIAGNOSIS — Z3689 Encounter for other specified antenatal screening: Secondary | ICD-10-CM | POA: Diagnosis not present

## 2017-09-05 DIAGNOSIS — Z3A20 20 weeks gestation of pregnancy: Secondary | ICD-10-CM | POA: Diagnosis not present

## 2017-09-05 DIAGNOSIS — Z3480 Encounter for supervision of other normal pregnancy, unspecified trimester: Secondary | ICD-10-CM

## 2017-09-05 LAB — CYSTIC FIBROSIS GENE TEST

## 2017-09-06 LAB — SMN1 COPY NUMBER ANALYSIS (SMA CARRIER SCREENING)

## 2017-09-07 ENCOUNTER — Encounter: Payer: Self-pay | Admitting: Family Medicine

## 2017-09-07 DIAGNOSIS — O0930 Supervision of pregnancy with insufficient antenatal care, unspecified trimester: Secondary | ICD-10-CM | POA: Insufficient documentation

## 2017-09-07 DIAGNOSIS — O99019 Anemia complicating pregnancy, unspecified trimester: Secondary | ICD-10-CM | POA: Insufficient documentation

## 2017-09-15 ENCOUNTER — Encounter (HOSPITAL_COMMUNITY): Payer: Self-pay | Admitting: *Deleted

## 2017-09-15 ENCOUNTER — Inpatient Hospital Stay (HOSPITAL_COMMUNITY)
Admission: AD | Admit: 2017-09-15 | Discharge: 2017-09-15 | Disposition: A | Discharge status: Home / Self Care | Payer: Medicaid Other | Source: Ambulatory Visit | Attending: Obstetrics and Gynecology | Admitting: Obstetrics and Gynecology

## 2017-09-15 ENCOUNTER — Other Ambulatory Visit: Payer: Self-pay

## 2017-09-15 DIAGNOSIS — O2342 Unspecified infection of urinary tract in pregnancy, second trimester: Secondary | ICD-10-CM | POA: Diagnosis not present

## 2017-09-15 DIAGNOSIS — Z3A22 22 weeks gestation of pregnancy: Secondary | ICD-10-CM | POA: Diagnosis not present

## 2017-09-15 DIAGNOSIS — R300 Dysuria: Secondary | ICD-10-CM | POA: Diagnosis present

## 2017-09-15 LAB — URINALYSIS, ROUTINE W REFLEX MICROSCOPIC
BILIRUBIN URINE: NEGATIVE
Glucose, UA: NEGATIVE mg/dL
HGB URINE DIPSTICK: NEGATIVE
KETONES UR: NEGATIVE mg/dL
NITRITE: NEGATIVE
PROTEIN: NEGATIVE mg/dL
SPECIFIC GRAVITY, URINE: 1.017 (ref 1.005–1.030)
pH: 7 (ref 5.0–8.0)

## 2017-09-15 MED ORDER — CEPHALEXIN 500 MG PO CAPS: 500.0000 mg | ORAL_CAPSULE | Freq: Four times a day (QID) | ORAL | 0 refills | Status: DC

## 2017-09-15 NOTE — MAU Provider Note (Signed)
History     CSN: [id number]  Arrival date and time: 09/15/17 1216   First Provider Initiated Contact with Patient 09/15/17 1300      Chief Complaint  Patient presents with  . Dysuria  . Back Pain   HPI  Ms.  Christina Jennings is a 27 y.o. year old G43P1021 female at [redacted]w[redacted]d weeks gestation who presents to MAU reporting strong urine smell, tingling with urinating and low back pain. She was tx'd for UTI in Feb of this year and she took all the medication. She reports goof (+) FM.  Past Medical History:  Diagnosis Date  . Chlamydia 2010  . Infection    UTI    Past Surgical History:  Procedure Laterality Date  . DILATION AND CURETTAGE OF UTERUS    . DILATION AND EVACUATION N/A 12/29/2016   Procedure: DILATATION AND EVACUATION;  Surgeon: Hermina Staggers, MD;  Location: [facility];  Service: Gynecology;  Laterality: N/A;  . WISDOM TOOTH EXTRACTION  2016    Family History  Problem Relation Age of Onset  . Heart disease Mother   . Heart disease Father   . Asthma Son   . Diabetes Maternal Aunt     Social History   Tobacco Use  . Smoking status: Never Smoker  . Smokeless tobacco: Never Used  Substance Use Topics  . Alcohol use: No  . Drug use: No    Allergies: No Known Allergies  Medications Prior to Admission  Medication Sig Dispense Refill Last Dose  . Prenatal Vit-Fe Fumarate-FA (PRENATAL MULTIVITAMIN) TABS tablet Take 1 tablet by mouth daily at 12 noon.   Taking  . ranitidine (ZANTAC) 150 MG capsule Take 1 capsule (150 mg total) by mouth daily. (Patient not taking: Reported on 07/23/2017) 30 capsule 0 Not Taking    Review of Systems  Constitutional: Negative.   HENT: Negative.   Eyes: Negative.   Respiratory: Negative.   Cardiovascular: Negative.   Gastrointestinal: Negative.   Endocrine: Negative.   Genitourinary: Positive for dysuria.       Strong urine smell  Musculoskeletal: Positive for back pain (lower back).  Skin: Negative.   Allergic/Immunologic: Negative.    Neurological: Negative.   Hematological: Negative.   Psychiatric/Behavioral: Negative.    Physical Exam   Blood pressure (!) 102/54, pulse 93, temperature 98 F (36.7 C), temperature source Oral, resp. rate 17, weight 166 lb (75.3 kg), last menstrual period 04/12/2017, SpO2 100 %.  Physical Exam  Nursing note and vitals reviewed. Constitutional: She appears well-developed and well-nourished.  HENT:  Head: Normocephalic and atraumatic.  Eyes: Pupils are equal, round, and reactive to light.  Neck: Normal range of motion.  Cardiovascular: Normal rate, regular rhythm and normal heart sounds.  Respiratory: Effort normal and breath sounds normal.  GI: Soft. Bowel sounds are normal. There is no CVA tenderness.  Genitourinary:  Genitourinary Comments: Pelvic deferred; no complaints  Musculoskeletal: Normal range of motion.  Neurological: She is alert.  Skin: Skin is warm and dry.  Psychiatric: She has a normal mood and affect. Her behavior is normal. Judgment and thought content normal.    MAU Course  Procedures  MDM CCUA UCx FHTs by doppler: 159 bpm   Results for orders placed or performed during the hospital encounter of 09/15/17 (from the past 24 hour(s))  Urinalysis, Routine w reflex microscopic     Status: Abnormal   Collection Time: 09/15/17 12:30 PM  Result Value Ref Range   Color, Urine YELLOW YELLOW   APPearance  HAZY (A) CLEAR   Specific Gravity, Urine 1.017 1.005 - 1.030   pH 7.0 5.0 - 8.0   Glucose, UA NEGATIVE NEGATIVE mg/dL   Hgb urine dipstick NEGATIVE NEGATIVE   Bilirubin Urine NEGATIVE NEGATIVE   Ketones, ur NEGATIVE NEGATIVE mg/dL   Protein, ur NEGATIVE NEGATIVE mg/dL   Nitrite NEGATIVE NEGATIVE   Leukocytes, UA MODERATE (A) NEGATIVE   RBC / HPF 0-5 0 - 5 RBC/hpf   WBC, UA TOO NUMEROUS TO COUNT 0 - 5 WBC/hpf   Bacteria, UA RARE (A) NONE SEEN   Squamous Epithelial / LPF 6-30 (A) NONE SEEN   Mucus PRESENT     Assessment and Plan  UTI (urinary tract  infection) during pregnancy, second trimester  - Rx for Keflex 500 mg QID x 7 days sent to pharmacy on file - Information provided on UTI in pregnancy - Discharge patient - Keep scheduled appt on 09/27/17 - Patient verbalized an understanding of the plan of care and agrees.   Raelyn Moraolitta Arbor Cohen, MSN, CNM  09/15/2017, 1:01 PM

## 2017-09-15 NOTE — MAU Note (Signed)
Recently treated for a UTI, finished meds, but still has symptoms.  (strong smell to urine, tingling when she urinates).  Aching in low back- not a new problem.  Neg CVA pain.

## 2017-09-18 LAB — CULTURE, OB URINE
Culture: 80000 — AB
Special Requests: NORMAL

## 2017-09-27 ENCOUNTER — Encounter: Payer: Medicaid Other | Admitting: Medical

## 2017-10-07 ENCOUNTER — Inpatient Hospital Stay (HOSPITAL_COMMUNITY)
Admission: AD | Admit: 2017-10-07 | Discharge: 2017-10-07 | Disposition: A | Discharge status: Home / Self Care | Payer: Medicaid Other | Source: Ambulatory Visit | Attending: Obstetrics and Gynecology | Admitting: Obstetrics and Gynecology

## 2017-10-07 ENCOUNTER — Encounter (HOSPITAL_COMMUNITY): Payer: Self-pay

## 2017-10-07 ENCOUNTER — Other Ambulatory Visit: Payer: Self-pay

## 2017-10-07 DIAGNOSIS — O99891 Other specified diseases and conditions complicating pregnancy: Secondary | ICD-10-CM

## 2017-10-07 DIAGNOSIS — Z3A25 25 weeks gestation of pregnancy: Secondary | ICD-10-CM | POA: Diagnosis not present

## 2017-10-07 DIAGNOSIS — O9989 Other specified diseases and conditions complicating pregnancy, childbirth and the puerperium: Secondary | ICD-10-CM | POA: Diagnosis not present

## 2017-10-07 DIAGNOSIS — O2342 Unspecified infection of urinary tract in pregnancy, second trimester: Secondary | ICD-10-CM | POA: Diagnosis not present

## 2017-10-07 DIAGNOSIS — O99019 Anemia complicating pregnancy, unspecified trimester: Secondary | ICD-10-CM

## 2017-10-07 DIAGNOSIS — N898 Other specified noninflammatory disorders of vagina: Secondary | ICD-10-CM | POA: Diagnosis present

## 2017-10-07 DIAGNOSIS — O26892 Other specified pregnancy related conditions, second trimester: Secondary | ICD-10-CM | POA: Diagnosis not present

## 2017-10-07 DIAGNOSIS — O99012 Anemia complicating pregnancy, second trimester: Secondary | ICD-10-CM

## 2017-10-07 DIAGNOSIS — M549 Dorsalgia, unspecified: Secondary | ICD-10-CM | POA: Insufficient documentation

## 2017-10-07 LAB — URINALYSIS, ROUTINE W REFLEX MICROSCOPIC
Bilirubin Urine: NEGATIVE
Glucose, UA: NEGATIVE mg/dL
HGB URINE DIPSTICK: NEGATIVE
KETONES UR: 20 mg/dL — AB
NITRITE: NEGATIVE
PH: 6 (ref 5.0–8.0)
Protein, ur: 30 mg/dL — AB
Specific Gravity, Urine: 1.03 (ref 1.005–1.030)

## 2017-10-07 LAB — WET PREP, GENITAL
Clue Cells Wet Prep HPF POC: NONE SEEN
Sperm: NONE SEEN
TRICH WET PREP: NONE SEEN
Yeast Wet Prep HPF POC: NONE SEEN

## 2017-10-07 NOTE — Discharge Instructions (Signed)
Back Pain in Pregnancy Back pain during pregnancy is common. Back pain may be caused by several factors that are related to changes during your pregnancy. Follow these instructions at home: Managing pain, stiffness, and swelling  If directed, apply ice for sudden (acute) back pain. ? Put ice in a plastic bag. ? Place a towel between your skin and the bag. ? Leave the ice on for 20 minutes, 2-3 times per day.  If directed, apply heat to the affected area before you exercise: ? Place a towel between your skin and the heat pack or heating pad. ? Leave the heat on for 20-30 minutes. ? Remove the heat if your skin turns bright red. This is especially important if you are unable to feel pain, heat, or cold. You may have a greater risk of getting burned. Activity  Exercise as told by your health care provider. Exercising is the best way to prevent or manage back pain.  Listen to your body when lifting. If lifting hurts, ask for help or bend your knees. This uses your leg muscles instead of your back muscles.  Squat down when picking up something from the floor. Do not bend over.  Only use bed rest as told by your health care provider. Bed rest should only be used for the most severe episodes of back pain. Standing, Sitting, and Lying Down  Do not stand in one place for long periods of time.  Use good posture when sitting. Make sure your head rests over your shoulders and is not hanging forward. Use a pillow on your lower back if necessary.  Try sleeping on your side, preferably the left side, with a pillow or two between your legs. If you are sore after a night's rest, your bed may be too soft. A firm mattress may provide more support for your back during pregnancy. General instructions  Do not wear high heels.  Eat a healthy diet. Try to gain weight within your health care provider's recommendations.  Use a maternity girdle, elastic sling, or back brace as told by your health care  provider.  Take over-the-counter and prescription medicines only as told by your health care provider.  Keep all follow-up visits as told by your health care provider. This is important. This includes any visits with any specialists, such as a physical therapist. Contact a health care provider if:  Your back pain interferes with your daily activities.  You have increasing pain in other parts of your body. Get help right away if:  You develop numbness, tingling, weakness, or problems with the use of your arms or legs.  You develop severe back pain that is not controlled with medicine.  You have a sudden change in bowel or bladder control.  You develop shortness of breath, dizziness, or you faint.  You develop nausea, vomiting, or sweating.  You have back pain that is a rhythmic, cramping pain similar to labor pains. Labor pain is usually 1-2 minutes apart, lasts for about 1 minute, and involves a bearing down feeling or pressure in your pelvis.  You have back pain and your water breaks or you have vaginal bleeding.  You have back pain or numbness that travels down your leg.  Your back pain developed after you fell.  You develop pain on one side of your back.  You see blood in your urine.  You develop skin blisters in the area of your back pain. This information is not intended to replace advice given to you   by your health care provider. Make sure you discuss any questions you have with your health care provider. Document Released: 09/07/2005 Document Revised: 11/05/2015 Document Reviewed: 02/11/2015 Elsevier Interactive Patient Education  2018 [employer]  

## 2017-10-07 NOTE — MAU Provider Note (Signed)
History   Christina Jennings is a 27 year old G4P1021 at [redacted]w[redacted]d gestation who presents to MAU due to vaginal irritation with white discharge x 3 days. Patient was treated for a UTI on 09/15/2017 with abx, but reports she only finished 4 out of the 7 days of Keflex . Over the past 3 days, patient has been experiencing increased white vaginal discharge and itching causing vaginal discomfort. She admits to increase in frequency of urination, but denies dysuria. She reports positive fetal movements with spontaneous sharp cramplike pains through her abdomen. She denies vaginal bleeding, malodor, fever, chills, and GI symptoms. She is currently sexually active without a change in partners.   CSN: [id number]  Arrival date and time: 10/07/17 1637   First Provider Initiated Contact with Patient 10/07/17 1742      Chief Complaint  Patient presents with  . Vaginal Discharge  . Abdominal Cramping     Past Medical History:  Diagnosis Date  . Chlamydia 2010  . Infection    UTI    Past Surgical History:  Procedure Laterality Date  . DILATION AND CURETTAGE OF UTERUS    . DILATION AND EVACUATION N/A 12/29/2016   Procedure: DILATATION AND EVACUATION;  Surgeon: Hermina Staggers, MD;  Location: [facility];  Service: Gynecology;  Laterality: N/A;  . WISDOM TOOTH EXTRACTION  2016    Family History  Problem Relation Age of Onset  . Heart disease Mother   . Heart disease Father   . Asthma Son   . Diabetes Maternal Aunt     Social History   Tobacco Use  . Smoking status: Never Smoker  . Smokeless tobacco: Never Used  Substance Use Topics  . Alcohol use: No  . Drug use: No    Allergies: No Known Allergies  Medications Prior to Admission  Medication Sig Dispense Refill Last Dose  . cephALEXin (KEFLEX) 500 MG capsule Take 1 capsule (500 mg total) by mouth 4 (four) times daily. 28 capsule 0   . Prenatal Vit-Fe Fumarate-FA (PRENATAL MULTIVITAMIN) TABS tablet Take 1 tablet by mouth daily at 12 noon.    Taking  . ranitidine (ZANTAC) 150 MG capsule Take 1 capsule (150 mg total) by mouth daily. (Patient not taking: Reported on 07/23/2017) 30 capsule 0 Not Taking    Review of Systems  Constitutional: Negative.   HENT: Negative.   Eyes: Negative.   Respiratory: Negative.   Cardiovascular: Negative.   Gastrointestinal: Negative.   Endocrine: Negative.   Genitourinary: Positive for frequency and vaginal discharge. Negative for decreased urine volume, difficulty urinating, dysuria, flank pain, genital sores, pelvic pain, urgency, vaginal bleeding and vaginal pain.  Musculoskeletal: Positive for back pain.  Skin: Negative.   Allergic/Immunologic: Negative.   Neurological: Negative.   Hematological: Negative.   Psychiatric/Behavioral: Negative.    Physical Exam   Blood pressure 110/83, pulse 98, temperature 98.6 F (37 C), temperature source Oral, resp. rate 16, height 5' (1.524 m), weight 75.6 kg (166 lb 12 oz), last menstrual period 04/12/2017, unknown if currently breastfeeding.  Physical Exam  Nursing note and vitals reviewed. Constitutional: She is oriented to person, place, and time. She appears well-developed and well-nourished. No distress.  Cardiovascular: Normal rate, regular rhythm and normal heart sounds.  Respiratory: Effort normal and breath sounds normal. No respiratory distress. She has no wheezes.  GI: Soft. She exhibits no distension. There is no tenderness. There is no rebound, no guarding and no CVA tenderness.  Genitourinary:  Genitourinary Comments: Pelvic performed by Donette Larry  Neurological: She is alert and oriented to person, place, and time.  Skin: Skin is warm.  Psychiatric: She has a normal mood and affect. Her behavior is normal. Judgment and thought content normal.    MAU Course  Procedures  MDM UA, wet prep  Assessment and Plan  1. Vaginal Discharge in pregnancy 2. Back pain in pregnancy -Discharge home in stable condition -Patient's wet  prep came back unremarkable. Patient advised to take warm soaks with baking soda. If symptoms do not improve, patient advised to get OTC yeast medication. -Patient advised to follow-up with OB for routine prenatal care  [facility] 10/07/2017, 6:02 PM

## 2017-10-07 NOTE — Progress Notes (Addendum)
G4p1 @ 25.[redacted] wksga. Here dt possible yeast infection since couple days. States feels like she can't empty bladder. Hx of this prior. Denies LOF or bleediing. +FM  EFM applied.   Provider at bs assessing. Wet prep done.   D/c instructions given with pt understanding. Pt left unit via ambulatory with SO.

## 2017-10-07 NOTE — MAU Provider Note (Signed)
History     CSN: [id number]  Arrival date and time: 10/07/17 1637   First Provider Initiated Contact with Patient 10/07/17 1742      Chief Complaint  Patient presents with  . Vaginal Discharge  . Abdominal Cramping   27 y.o. [id number] .3 wks here with vaginal irritation and discharge. Sx started 3 days ago. No new partners. Recent STD screen negative. Having urinary frequency but no other sx. Was treated for UTI on 09/15/17. She feels some sharp intermittent pains in her abdomen from upper to lower. Started yesterday. Hasn't taken anything for it. Reports +FM. No VB, LOF, or ctx.    OB History    Gravida  4   Para  1   Term  1   Preterm  0   AB  2   Living  1     SAB  2   TAB  0   Ectopic  0   Multiple  0   Live Births  1           Past Medical History:  Diagnosis Date  . Chlamydia 2010  . Infection    UTI    Past Surgical History:  Procedure Laterality Date  . DILATION AND CURETTAGE OF UTERUS    . DILATION AND EVACUATION N/A 12/29/2016   Procedure: DILATATION AND EVACUATION;  Surgeon: Hermina Staggers, MD;  Location: [facility];  Service: Gynecology;  Laterality: N/A;  . WISDOM TOOTH EXTRACTION  2016    Family History  Problem Relation Age of Onset  . Heart disease Mother   . Heart disease Father   . Asthma Son   . Diabetes Maternal Aunt     Social History   Tobacco Use  . Smoking status: Never Smoker  . Smokeless tobacco: Never Used  Substance Use Topics  . Alcohol use: No  . Drug use: No    Allergies: No Known Allergies  Medications Prior to Admission  Medication Sig Dispense Refill Last Dose  . cephALEXin (KEFLEX) 500 MG capsule Take 1 capsule (500 mg total) by mouth 4 (four) times daily. 28 capsule 0   . Prenatal Vit-Fe Fumarate-FA (PRENATAL MULTIVITAMIN) TABS tablet Take 1 tablet by mouth daily at 12 noon.   Taking  . ranitidine (ZANTAC) 150 MG capsule Take 1 capsule (150 mg total) by mouth daily. (Patient not taking: Reported on  07/23/2017) 30 capsule 0 Not Taking    Review of Systems  Gastrointestinal: Positive for abdominal pain.  Genitourinary: Positive for frequency and vaginal discharge. Negative for dysuria, hematuria, urgency and vaginal bleeding.  Musculoskeletal: Positive for back pain.   Physical Exam   Blood pressure 110/83, pulse 98, temperature 98.6 F (37 C), temperature source Oral, resp. rate 16, height 5' (1.524 m), weight 166 lb 12 oz (75.6 kg), last menstrual period 04/12/2017, unknown if currently breastfeeding.  Physical Exam  Nursing note and vitals reviewed. Constitutional: She is oriented to person, place, and time. She appears well-developed and well-nourished. No distress.  HENT:  Head: Normocephalic and atraumatic.  Neck: Normal range of motion.  Respiratory: Effort normal. No respiratory distress.  GI: Soft. She exhibits no distension. There is no tenderness. There is no CVA tenderness.  gravid  Genitourinary:  Genitourinary Comments: External: no lesions or erythema Vagina: rugated, pink, moist, scant thin white discharge Cervix closed/long   Musculoskeletal: Normal range of motion.       Cervical back: Normal.       Thoracic back: Normal.  Lumbar back: Normal.  Neurological: She is alert and oriented to person, place, and time.  Skin: Skin is warm and dry.  Psychiatric: She has a normal mood and affect.  EFM: 140 bpm, mod variability, + accels, no decels Toco: none  Results for orders placed or performed during the hospital encounter of 10/07/17 (from the past 24 hour(s))  Urinalysis, Routine w reflex microscopic     Status: Abnormal   Collection Time: 10/07/17  5:12 PM  Result Value Ref Range   Color, Urine YELLOW YELLOW   APPearance CLEAR CLEAR   Specific Gravity, Urine 1.030 1.005 - 1.030   pH 6.0 5.0 - 8.0   Glucose, UA NEGATIVE NEGATIVE mg/dL   Hgb urine dipstick NEGATIVE NEGATIVE   Bilirubin Urine NEGATIVE NEGATIVE   Ketones, ur 20 (A) NEGATIVE mg/dL    Protein, ur 30 (A) NEGATIVE mg/dL   Nitrite NEGATIVE NEGATIVE   Leukocytes, UA TRACE (A) NEGATIVE   RBC / HPF 0-5 0 - 5 RBC/hpf   WBC, UA 11-20 0 - 5 WBC/hpf   Bacteria, UA RARE (A) NONE SEEN   Squamous Epithelial / LPF 0-5 0 - 5   Mucus PRESENT   Wet prep, genital     Status: Abnormal   Collection Time: 10/07/17  6:20 PM  Result Value Ref Range   Yeast Wet Prep HPF POC NONE SEEN NONE SEEN   Trich, Wet Prep NONE SEEN NONE SEEN   Clue Cells Wet Prep HPF POC NONE SEEN NONE SEEN   WBC, Wet Prep HPF POC FEW (A) NONE SEEN   Sperm NONE SEEN    MAU Course  Procedures  MDM Labs ordered and reviewed. No evidence of yeast infection. Recommend tub soaks with baking soda for irritation. Can try OTC Monistat if that doesn't help. No evidence of PTL. Pain likely physiologic to advancing pregnancy. Stable for discharge home.  Assessment and Plan  [redacted] weeks gestation Back pain in pregnancy Vaginal discharge in pregnancy  Discharge home Follow up in OB office as scheduled Return precautions  Allergies as of 10/07/2017   No Known Allergies     Medication List    STOP taking these medications   cephALEXin 500 MG capsule Commonly known as:  KEFLEX     TAKE these medications   prenatal multivitamin Tabs tablet Take 1 tablet by mouth daily at 12 noon.   ranitidine 150 MG capsule Commonly known as:  ZANTAC Take 1 capsule (150 mg total) by mouth daily.      Donette Larry, CNM 10/07/2017, 6:35 PM

## 2017-10-07 NOTE — MAU Note (Signed)
Pt presents with "sharp cramps going down my stomach" that started yesterday and vaginal itching with white discharge.

## 2017-10-11 ENCOUNTER — Encounter: Payer: Self-pay | Admitting: Advanced Practice Midwife

## 2017-10-11 ENCOUNTER — Ambulatory Visit (INDEPENDENT_AMBULATORY_CARE_PROVIDER_SITE_OTHER): Payer: Medicaid Other | Admitting: Advanced Practice Midwife

## 2017-10-11 VITALS — BP 108/51 | HR 96 | Wt 168.0 lb

## 2017-10-11 DIAGNOSIS — N898 Other specified noninflammatory disorders of vagina: Secondary | ICD-10-CM

## 2017-10-11 DIAGNOSIS — Z3480 Encounter for supervision of other normal pregnancy, unspecified trimester: Secondary | ICD-10-CM

## 2017-10-11 NOTE — Progress Notes (Signed)
Pt having vaginal irritation, white discharge

## 2017-10-11 NOTE — Patient Instructions (Addendum)
Gestational diabetes mellitus (GDM) is high blood glucose (high blood sugar) that develops during pregnancy (ADA, 2018). With routine prenatal care provided in the [place] ([place] most people drink "Glucola" as part of a screening test before diagnosing gestational diabetes. In other parts of the world, care providers may give mothers a screening test and/or a diagnostic test for GDM using other types of glucose drinks. Diagnosing gestational diabetes is a complex topic with lots of controversy. Even though we have a lot of research on diagnosing gestational diabetes, professionals around the world still disagree on the best way to screen for and diagnose this condition. This article will describe gestational diabetes, explain the reasons for the disagreement over diagnosing gestational diabetes, and discuss the potential risks linked to the condition, as well as the potential benefits from treatment. What is gestational diabetes? To understand gestational diabetes, it's helpful to first learn how the body metabolizes sugar. After you eat or drink carbohydrates (often called "carbs"), your gastrointestinal system helps the carbohydrates enter your bloodstream as glucose (often called "sugar"), which your body must turn into energy. Insulin is a hormone produced by the pancreas that helps deliver glucose from the blood into your body's cells, where the glucose can be turned into energy that fuels your body's functions. Insulin also helps convert extra glucose into fat for storage. All pregnant people experience some metabolic changes during pregnancy. In normal pregnancy, hormones from the placenta make it harder for your body to use insulin-you may require up to three times as much insulin to overcome the increased insulin resistance (ADA, 2016). Insulin resistance means that your cells are resistant to insulin-it's kind of like if a neighbor (i.e. insulin) keeps knocking on your door (i.e. cell) with  gifts of food, and over time, has to knock louder and louder to get you to open the door! In a pregnancy that is not complicated by gestational diabetes, it's harder for insulin to 'open the door,' but the body's insulin response, or ability to produce more insulin, is enough to overcome the resistance. However, with gestational diabetes, there is too much insulin resistance, too little insulin response (called low beta cell function), or a combination of both (Powe et al. 2016). Some women with GDM have more of a problem with insulin resistance, while others with GDM have more of a problem with low beta cell function (Personal correspondence, Dr. Barbour, 2018). Going back to our analogy, low beta cell function is like if the neighbor who is knocking gets tired over time and knocks more softly. So, with GDM, the door doesn't open because of your high reluctance to answer it (insulin resistance), the neighbor's low intensity in knocking (low beta cell function), or a combination of both factors. You can imagine that with either scenario, the neighbor gives up and takes the food somewhere else. In a similar way, when this happens with GDM, glucose builds up in the blood until it reaches abnormally high levels, called hyperglycemia. The routine tests that are done in pregnancy to identify GDM do not directly measure insulin resistance or beta cell function. Instead, the tests measure blood sugar levels, because it is high blood sugar that can cause problems for mother and baby. If you have GDM, treatment with diet, exercise, and sometimes medicine, is necessary to maintain healthy blood sugar levels. Researchers think insulin resistance exists to help move more nutrients to the baby (instead of the mother) to promote healthy fetal growth and development (Farrar et al. 2017a). The   mother's body is making sure that the baby gets enough nutrition from sugar in the blood, even if food becomes scarce for the mother.  This adaptation helped [place] in the past, but most people today have too much food available-including too many processed foods with simple, easily digested sugars. This situation has led to more people putting on extra body weight, which tends to increase insulin resistance and decrease beta cell function, which further increases the risk of high blood sugar. The one-part diagnostic method  Outside of the [place] most countries promote some variation of the one-part diagnostic method (universal or selective), although in [place] they have endorsed a different version of the two-part screening and diagnostic method (Table 5). According to IADPSG criteria, the one-part diagnostic test is a 75-gram, 2-hour OGTT, which requires fasting before the test. This OGTT measures blood sugar after fasting and again at one and two hours after the test. Gestational diabetes is diagnosed with one or more high blood sugar values. Method: . Drink a 75-gram diagnostic OGTT, with blood sugar measured after fasting (?8 hours) and at 1 and 2-hours after the test.  . The diagnosis of GDM is made when any of the following blood sugar values are met or exceeded:  . Fasting: 92 mg/dL  . 1-hour: 180 mg/dL  . 2-hour: 153 mg/dL Unlike the two-part screening and diagnostic method, the one-part diagnostic method test cutoffs were developed based specifically on pregnancy and birth outcomes instead of the mother's future risk of diabetes (IADPSG, 2010; ADA, 2018). As we already mentioned, adopting the IADPSG criteria would greatly increase the rate of people diagnosed with GDM (NIH, 2013). Using the one-part diagnostic method, more people would potentially benefit from treatment for high blood sugar. However, there are also downsides (which is why there is no agreement on which method is best). Using the 75-gram test with IADPSG criteria, everyone has to fast before the test, which may be difficult for some people. Also, an increase in the  number of people diagnosed with GDM comes with an increase in personal and health care costs. Mothers diagnosed with GDM face more medical appointments (to meet with a registered dietitian, a diabetes educator, or both) and they are told to carefully watch what they eat and monitor blood sugar levels several times a day (NIH, 2013). Testing supplies, blood sugar medication (if needed), and extra monitoring all come with significant costs, which are not always fully covered by insurance in the [place] A diagnosis of GDM can be stressful for some mothers, and care providers may pressure women to schedule an induction simply because they have been diagnosed as having GDM. The bottom line . We have strong evidence that treating GDM improves birth outcomes for mothers and babies. . Gestational diabetes begins during pregnancy, but some people enter pregnancy with pre-existing diabetes (type 2 diabetes) that was previously undiagnosed. To detect pre-existing diabetes, care providers may offer screening in early pregnancy to mothers with risk factors for type 2 diabetes. . There is widespread agreement that screening or testing for GDM should take place between 24 and 28 weeks of pregnancy. However, researchers and organizations disagree about the best way to screen and diagnose GDM: . Some countries and professional organizations (such as ACOG in the [place].) prefer a two-part method that includes a screening test (frequently the "Glucola" drink), and if that is positive, women take a diagnostic test (which involves fasting, drinking a glucose beverage, and having multiple blood tests). . However, most other countries  and organizations prefer a one-part method where everybody (or at least everybody with risk factors for GDM) receives the one-part diagnostic test. . With the two-part screening and diagnostic method used in the [place] cutoffs for GDM diagnosis vary by hospital. When you get your results, it may be helpful  to obtain the actual numbers, rather than a statement that you "passed" or "failed" the glucose test. Compare your test results with the Carpenter-Coustan or National Diabetes Data Group Criteria to get a better feel for where your results fall. . Although many people contact us about alternatives to drinking the standard glucose solution, the evidence on alternatives is very limited at this time: . We don't know if getting a sugar load from candy, juice, or food screens for GDM as well as the standard glucose drink. Marland Kitchen Researchers have suggested that a fasting plasma glucose test in the third trimester may be useful as a screening test when it is used with upper and lower cutoffs to 'rule-in' or 'rule-out' GDM. However, more research is needed. Marland Kitchen People who would rather not drink the standard glucose beverage, or who can't due to vomiting or other reasons, could discuss alternative methods with their provider. However, we do not have sufficient evidence on alternatives at this time to state which alternative is best, or how accurate these alternatives may be. Receiving a diagnosis of GDM can be stressful for many people. However, the benefits of a positive test result are that you can uncover the potential for health problems before they become a real problem, and take action to improve your health and birth outcomes. [facility] [place] [address] [place], [place]  [address] Phone - [phone]   Fax - [phone]  [facility] [address] [place], [place] [address] Phone - [phone]   Fax - [place] [address] [place], [place]  [address] Phone - [phone]   Fax - [phone]  [place] [address] [place] [address] [place], [place]  [address] Phone - [phone]   Fax - [phone]  [place] [address] [place], [place]  [address] Phone  - [phone]   Fax - [phone]  [facility] [address] [place], [place]  [address] Phone - [phone]   Fax - [place] [address], [place], [place]  [address] Phone - [phone]   Fax - [phone]  [place] [address] [place], [place] [address] [place], [place]  [address] Phone - [phone]   Fax - [place] [address] [place], [place]  [address] Phone - [phone]   Fax - [phone] [facility] [place]. [address] [place], [place]  [address] Phone - [phone]   Fax - [phone]  [place] [address] [place], [place]  [address] Phone - [phone]   Fax - [phone]  [facility] [place], [place], [place]  [address] Phone - [phone]   Fax - [place] [address], [place], [place]  [address] Phone - [phone]   Fax - [phone]  [facility] [address] [place], [place]  [address] Phone - [place] [address] [place],   [address] Phone - [phone]  Fax - [place] [address] [place], [place]  [address] Phone - [phone]   Fax - [phone]  [place] [address] [place], [place]  [address] Phone - [phone]   Fax - [phone]  [place] [place], [place]  [address] Phone - [phone]   Fax - [place], [place], [place]  [address] Phone - [phone]   Fax - [place] [address], [place], [place]  [address] Phone - [phone]   Fax - [phone]  [place]VID RUBIN [address] [place], [place]  [address] Phone - [phone]   Fax - [place] [address], [place], [place]  [address] Phone - [phone]   Fax - [phone]  [place] [address] [place], [place]  [address] Phone - [phone]   Fax - [phone] [place]nal[place] [address] [place], [place]  [address] Phone - [phone]   Fax - [place] [address] [place], [place]  [address] Phone - [phone]   Fax - [phone]  [place] [address] [place], [place]  [address] Phone - [phone]   Fax - [phone]  Anton Ruiz MD [address] [place] [address] Phone [phone]  Fax [phone]  Childbirth Education Options: [facility] Department Classes:  Childbirth education classes can help you get ready for a positive parenting experience. You can also meet other expectant parents and get free stuff for your baby. Each class runs for five weeks on the same night and costs $45 for the mother-to-be and her support person. Medicaid covers the cost if you are eligible. Call [phone] to register. [facility] [place] Childbirth Education:  [phone] or [phone] or [email or web address]  Baby & Me Class: Discuss newborn & infant parenting and family adjustment issues with other new mothers in a relaxed environment. Each week brings a new speaker or baby-centered activity. We encourage new mothers to join [place] every Thursday at 11:00am. Babies birth until crawling. No registration or fee. Daddy [employer]: This course offers Dads-to-be the tools and knowledge needed to feel confident on their journey to becoming new fathers. Experienced dads, who have been trained as coaches, teach dads-to-be how to hold, comfort, diaper, swaddle and play with their  infant while being able to support the new mom as well. A class for men taught by men. $25/dad Big Brother/Big Sister: Let your children share in the joy of a new brother or sister in this special class designed just for them. Class includes discussion about how families care for babies: swaddling, holding, diapering, safety as well as how they can be helpful in their new role. This class is designed for children ages 56 to 50, but any age is welcome. Please register each child individually. $5/child  Mom Talk: This mom-led group offers support and connection to mothers as they journey through the adjustments and struggles of that sometimes overwhelming first year after the birth of a child. Tuesdays at 10:00am and Thursdays at 6:00pm. Babies welcome. No registration or fee. Breastfeeding Support Group: This group is a mother-to-mother support circle where moms have the opportunity to share their breastfeeding experiences. A Lactation Consultant is present for questions and concerns. Meets each Tuesday at 11:00am. No  fee or registration. Breastfeeding Your Baby: Learn what to expect in the first days of breastfeeding your newborn.  This class will help you feel more confident with the skills needed to begin your breastfeeding experience. Many new mothers are concerned about breastfeeding after leaving the hospital. This class will also address the most common fears and challenges about breastfeeding during the first few weeks, months and beyond. (call for fee) Comfort Techniques and Tour: This 2 hour interactive class will provide you the opportunity to learn & practice hands-on techniques that can help relieve some of the discomfort of labor and encourage your baby to rotate toward the best position for birth. You and your partner will be able to try a variety of labor positions with birth balls and rebozos as well as practice breathing, relaxation, and visualization techniques. A tour of the [facility] is included with this class. $20 per registrant and support person Childbirth Class- Weekend Option: This class is a Weekend version of our Birth & Baby series. It is designed for parents who have a difficult time fitting several weeks of classes into their schedule. It covers the care of your newborn and the basics of labor and childbirth. It also includes a [place] [facility] and lunch. The class is held two consecutive days: beginning on Friday evening from 6:30 - 8:30 p.m. and the next day, Saturday from 9 a.m. - 4 p.m. (call for fee) Doren Custard Class: Interested in a waterbirth?  This informational class will help you discover whether waterbirth is the right fit for you. Education about waterbirth itself, supplies you would need and how to assemble your support team is what you can expect from this class. Some obstetrical practices require this class in order to pursue a waterbirth. (Not all obstetrical practices offer waterbirth-check with your healthcare provider.) Register only the expectant mom, but you are encouraged to bring your partner to class! Required if planning waterbirth, no fee. Infant/Child CPR: Parents, grandparents, babysitters, and friends learn Cardio-Pulmonary Resuscitation skills for infants and children. You will also learn how to treat both conscious and unconscious choking in infants and children. This Family & Friends program does not offer certification. Register each participant individually to ensure that enough mannequins are available. (Call for fee) Grandparent Love: Expecting a grandbaby? This class is for you! Learn about the latest infant care and safety recommendations and ways to support your own child as he or she transitions into the parenting role. Taught by Registered Nurses who are childbirth instructors, but most importantly...they are grandmothers too! $10/person. Childbirth Class- Natural Childbirth: This series  of 5 weekly classes is for expectant parents who want to learn and practice natural methods of coping with the process of labor and childbirth. Relaxation, breathing, massage, visualization, role of the partner, and helpful positioning are highlighted. Participants learn how to be confident in their body's ability to give birth. This class will empower and help parents make informed decisions about their own care. Includes discussion that will help new parents transition into the immediate postpartum period. [facility] is included. We suggest taking this class between 25-32 weeks, but it's only a recommendation. $75 per registrant and one support person or $30 Medicaid. Childbirth Class- 3 week Series: This option of 3 weekly classes helps you and your labor partner prepare for childbirth. Newborn care, labor & birth, cesarean birth, pain management, and comfort techniques are discussed and a Maternity Care  Center Tour of [facility] is included. The class meets at the same time, on the same day of the week for 3 consecutive weeks beginning with the starting date you choose. $60 for registrant and one support person.  Marvelous Multiples: Expecting twins, triplets, or more? This class covers the differences in labor, birth, parenting, and breastfeeding issues that face multiples' parents. NICU tour is included. Led by a Certified Childbirth Educator who is the mother of twins. No fee. Caring for Baby: This class is for expectant and adoptive parents who want to learn and practice the most up-to-date newborn care for their babies. Focus is on birth through the first six weeks of life. Topics include feeding, bathing, diapering, crying, umbilical cord care, circumcision care and safe sleep. Parents learn to recognize symptoms of illness and when to call the pediatrician. Register only the mom-to-be and your partner or support person can plan to come with you! $10 per  registrant and support person Childbirth Class- online option: This online class offers you the freedom to complete a Birth and Baby series in the comfort of your own home. The flexibility of this option allows you to review sections at your own pace, at times convenient to you and your support people. It includes additional video information, animations, quizzes, and extended activities. Get organized with helpful eClass tools, checklists, and trackers. Once you register online for the class, you will receive an email within a few days to accept the invitation and begin the class when the time is right for you. The content will be available to you for 60 days. $60 for 60 days of online access for you and your support people.  Local Doulas: Natural Baby Doulas [email or web address] Tel: [phone] [email or web address] [employer] [phone] [email or web address] The Labor Hassell Halim  (also do waterbirth tub rental) [phone] [email or web address] Triad Birth Doula [phone] [email or web address] Sacred Rhythms  [phone] [email or web address] [employer] (PADA) [email or web address] [place] Birth and Baby  [email or web address] Considering Waterbirth? Guide for patients at [facility] [employer]  Why consider waterbirth?  . Gentle birth for babies . Less pain medicine used in labor . May allow for passive descent/less pushing . May reduce perineal tears  . More mobility and instinctive maternal position changes . Increased maternal relaxation . Reduced blood pressure in labor  Is waterbirth safe? What are the risks of infection, drowning or other complications?  . Infection: o Very low risk (3.7 % for tub vs 4.8% for bed) o 7 in 8000 waterbirths with documented  infection o Poorly cleaned equipment most common cause o Slightly lower group B strep transmission rate  . Drowning o Maternal:  - Very low risk   - Related to seizures or fainting o Newborn:  - Very low risk. No evidence of increased risk of respiratory problems in multiple large studies - Physiological protection from breathing under water - Avoid underwater birth if there are any fetal complications - Once baby's head is out of the water, keep it out.  . Birth complication o Some reports of cord trauma, but risk decreased by bringing baby to surface gradually o No evidence of increased risk of shoulder dystocia. Mothers can usually change positions faster in water than in a bed, possibly aiding the maneuvers to free the shoulder.   You must attend a Doren Custard class at [facility]  3rd Wednesday of every month from 7-9pm  Free  [employer] by calling [phone] or online at [email or web address]  Bring [place] the certificate  from the class to your prenatal appointment  Meet with a midwife at 36 weeks to see if you can still plan a waterbirth and to sign the consent.   Purchase or rent the following supplies:   Water Birth Pool (Birth Pool in a Box or [place] for instance)  (Tubs start ~$125)  Single-use disposable tub liner designed for your brand of tub  New garden hose labeled "lead-free", "suitable for drinking water",  Electric drain pump to remove water (We recommend 792 gallon per hour or greater pump.)   Separate garden hose to remove the dirty water  Fish net  Bathing suit top (optional)  Long-handled mirror (optional)  Places to purchase or rent supplies  [email or web address].is for tub purchases and supplies  Waterbirthsolutions.com for tub purchases and supplies  The Labor Ladies ([email or web address]) $275 for tub rental/set-up & take down/kit   [employer] (http://www.[email or web address]) Information regarding doulas  (labor support) who provide pool rentals  Our practice has a Birth Pool in a Box tub at the hospital that you may borrow on a first-come-first-served basis. It is your responsibility to to set up, clean and break down the tub. We cannot guarantee the availability of this tub in advance. You are responsible for bringing all accessories listed above. If you do not have all necessary supplies you cannot have a waterbirth.    Things that would prevent you from having a waterbirth:  Premature, <37wks  Previous cesarean birth  Presence of thick meconium-stained fluid  Multiple gestation (Twins, triplets, etc.)  Uncontrolled diabetes or gestational diabetes requiring medication  Hypertension requiring medication or diagnosis of pre-eclampsia  Heavy vaginal bleeding  Non-reassuring fetal heart rate  Active infection (MRSA, etc.). Group B Strep is NOT a contraindication for  waterbirth.  If your labor has to be induced and induction method requires continuous  monitoring of the baby's heart rate  Other risks/issues identified by your obstetrical provider  Please remember that birth is unpredictable. Under certain unforeseeable circumstances your provider may advise against giving birth in the tub. These decisions will be made on a case-by-case basis and with the safety of you and your baby as our highest priority.      Contraception Choices Contraception, also called birth control, refers to methods or devices that prevent pregnancy. Hormonal methods Contraceptive implant A contraceptive implant is a thin, plastic tube that contains a hormone. It is inserted into the upper part of the arm. It can remain in place for up to 3 years. Progestin-only injections Progestin-only injections are injections of progestin, a synthetic form of the hormone progesterone. They are given every 3 months by a health care provider. Birth control pills Birth control pills are pills that contain hormones  that prevent pregnancy. They must be taken once a day, preferably at the same time each day. Birth control patch The birth control patch contains hormones that prevent pregnancy. It is placed on the skin and must be changed once a week for three weeks and removed on the fourth week. A prescription is needed to use this method of contraception. Vaginal ring A vaginal ring contains hormones that prevent pregnancy. It is placed in the vagina for three weeks and removed on the fourth week. After that, the process is repeated with a new ring. A prescription is needed to use this method of contraception. Emergency contraceptive Emergency contraceptives prevent pregnancy after unprotected sex. They come in pill form and can be taken up to 5 days after sex. They work  best the sooner they are taken after having sex. Most emergency contraceptives are available without a prescription. This method should not be used as your only form of birth control. Barrier methods Female condom A female condom is a thin sheath that is worn over the penis during sex. Condoms keep sperm from going inside a woman's body. They can be used with a spermicide to increase their effectiveness. They should be disposed after a single use. Female condom A female condom is a soft, loose-fitting sheath that is put into the vagina before sex. The condom keeps sperm from going inside a woman's body. They should be disposed after a single use. Diaphragm A diaphragm is a soft, dome-shaped barrier. It is inserted into the vagina before sex, along with a spermicide. The diaphragm blocks sperm from entering the uterus, and the spermicide kills sperm. A diaphragm should be left in the vagina for 6-8 hours after sex and removed within 24 hours. A diaphragm is prescribed and fitted by a health care provider. A diaphragm should be replaced every 1-2 years, after giving birth, after gaining more than 15 lb (6.8 kg), and after pelvic surgery. Cervical  cap A cervical cap is a round, soft latex or plastic cup that fits over the cervix. It is inserted into the vagina before sex, along with spermicide. It blocks sperm from entering the uterus. The cap should be left in place for 6-8 hours after sex and removed within 48 hours. A cervical cap must be prescribed and fitted by a health care provider. It should be replaced every 2 years. Sponge A sponge is a soft, circular piece of polyurethane foam with spermicide on it. The sponge helps block sperm from entering the uterus, and the spermicide kills sperm. To use it, you make it wet and then insert it into the vagina. It should be inserted before sex, left in for at least 6 hours after sex, and removed and thrown away within 30 hours. Spermicides Spermicides are chemicals that kill or block sperm from entering the cervix and uterus. They can come as a cream, jelly, suppository, foam, or tablet. A spermicide should be inserted into the vagina with an applicator at least 15-17 minutes before sex to allow time for it to work. The process must be repeated every time you have sex. Spermicides do not require a prescription. Intrauterine contraception Intrauterine device (IUD) An IUD is a T-shaped device that is put in a woman's uterus. There are two types:  Hormone IUD.This type contains progestin, a synthetic form of the hormone progesterone. This type can stay in place for 3-5 years.  Copper IUD.This type is wrapped in copper wire. It can stay in place for 10 years.  Permanent methods of contraception Female tubal ligation In this method, a woman's fallopian tubes are sealed, tied, or blocked during surgery to prevent eggs from traveling to the uterus. Hysteroscopic sterilization In this method, a small, flexible insert is placed into each fallopian tube. The inserts cause scar tissue to form in the fallopian tubes and block them, so sperm cannot reach an egg. The procedure takes about 3 months to be  effective. Another form of birth control must be used during those 3 months. Female sterilization This is a procedure to tie off the tubes that carry sperm (vasectomy). After the procedure, the man can still ejaculate fluid (semen). Natural planning methods Natural family planning In this method, a couple does not have sex on days when the woman could become pregnant.  Calendar method This means keeping track of the length of each menstrual cycle, identifying the days when pregnancy can happen, and not having sex on those days. Ovulation method In this method, a couple avoids sex during ovulation. Symptothermal method This method involves not having sex during ovulation. The woman typically checks for ovulation by watching changes in her temperature and in the consistency of cervical mucus. Post-ovulation method In this method, a couple waits to have sex until after ovulation. Summary  Contraception, also called birth control, means methods or devices that prevent pregnancy.  Hormonal methods of contraception include implants, injections, pills, patches, vaginal rings, and emergency contraceptives.  Barrier methods of contraception can include female condoms, female condoms, diaphragms, cervical caps, sponges, and spermicides.  There are two types of IUDs (intrauterine devices). An IUD can be put in a woman's uterus to prevent pregnancy for 3-5 years.  Permanent sterilization can be done through a procedure for males, females, or both.  Natural family planning methods involve not having sex on days when the woman could become pregnant. This information is not intended to replace advice given to you by your health care provider. Make sure you discuss any questions you have with your health care provider. Document Released: 05/30/2005 Document Revised: 07/02/2016 Document Reviewed: 07/02/2016 Elsevier Interactive Patient Education  2018 [place] to have your son circumcised:     [facility] [phone] while you are in hospital  [facility] [phone] $244 by 4 wks  [facility] [phone] $175 by 2 wks  [facility] [phone] $250 by 7 days [facility] [phone] $269 by 4 wks  These prices sometimes change but are roughly what you can expect to pay. Please call and confirm pricing.   Circumcision is considered an elective/non-medically necessary procedure. There are many reasons parents decide to have their sons circumsized. During the first year of life circumcised males have a reduced risk of urinary tract infections but after this year the rates between circumcised males and uncircumcised males are the same.  It is safe to have your son circumcised outside of the hospital and the places above perform them regularly.   Deciding about Circumcision in Baby Boys  (Up-to-date The Basics)  What is circumcision?  Circumcision is a surgery that removes the skin that covers the tip of the penis, called the "foreskin" Circumcision is usually done when a boy is between 56 and 46 days old. In the [place], circumcision is common. In some other countries, fewer boys are circumcised. Circumcision is a common tradition in some religions.  Should I have my baby boy circumcised?  There is no easy answer. Circumcision has some benefits. But it also has risks. After talking with your doctor, you will have to decide for yourself what is right for your family.  What are the benefits of circumcision?  Circumcised boys seem to have slightly lower rates of: ?Urinary tract infections ?Swelling of the opening at the tip of the penis Circumcised men seem to have slightly lower rates of: ?Urinary tract infections ?Swelling of the opening at the tip of the penis ?Penis cancer ?HIV and other infections that you  catch during sex ?Cervical cancer in the women they have sex with Even so, in the [place], the risks of these problems are small - even in boys and men who have not been circumcised. Plus, boys and men who are not circumcised can reduce these extra risks by: ?Cleaning their penis well ?Using condoms during sex  What  are the risks of circumcision?  Risks include: ?Bleeding or infection from the surgery ?Damage to or amputation of the penis ?A chance that the doctor will cut off too much or not enough of the foreskin ?A chance that sex won't feel as good later in life Only about 1 out of every 200 circumcisions leads to problems. There is also a chance that your health insurance won't pay for circumcision.  How is circumcision done in baby boys?  First, the baby gets medicine for pain relief. This might be a cream on the skin or a shot into the base of the penis. Next, the doctor cleans the baby's penis well. Then he or she uses special tools to cut off the foreskin. Finally, the doctor wraps a bandage (called gauze) around the baby's penis. If you have your baby circumcised, his doctor or nurse will give you instructions on how to care for him after the surgery. It is important that you follow those instructions carefully.

## 2017-10-11 NOTE — Progress Notes (Signed)
   PRENATAL VISIT NOTE  Subjective:  Christina Jennings is a 27 y.o. G4P1021 at [redacted]w[redacted]d being seen today for ongoing prenatal care.  She is currently monitored for the following issues for this low-risk pregnancy and has Supervision of other normal pregnancy, antepartum; Late prenatal care affecting pregnancy; Anemia affecting pregnancy, antepartum; and UTI (urinary tract infection) during pregnancy, second trimester on their problem list.  Patient reports vulvar irritation .  Contractions: Not present. Vag. Bleeding: None.  Movement: Present. Denies leaking of fluid.   Patient was seen in [facility] on 10/07/17 for vulvar irritation. She reports that this has persisted.   The following portions of the patient's history were reviewed and updated as appropriate: allergies, current medications, past family history, past medical history, past social history, past surgical history and problem list. Problem list updated.  Objective:   Vitals:   10/11/17 1629  BP: (!) 108/51  Pulse: 96  Weight: 168 lb (76.2 kg)    Fetal Status: Fetal Heart Rate (bpm): 140   Movement: Present     General:  Alert, oriented and cooperative. Patient is in no acute distress.  Skin: Skin is warm and dry. No rash noted.   Cardiovascular: Normal heart rate noted  Respiratory: Normal respiratory effort, no problems with respiration noted  Abdomen: Soft, gravid, appropriate for gestational age.  Pain/Pressure: Present     Pelvic: Cervical exam deferred       small herpatic appearing lesions bilaterally  Extremities: Normal range of motion.  Edema: None  Mental Status: Normal mood and affect. Normal behavior. Normal judgment and thought content.   Assessment and Plan:  Pregnancy: G4P1021 at 102w0d  1. Supervision of other normal pregnancy, antepartum - Routine care - GTT at [facility]  - HSV PCR today   Preterm labor symptoms and general obstetric precautions including but not limited to vaginal bleeding, contractions, leaking of  fluid and fetal movement were reviewed in detail with the patient. Please refer to After Visit Summary for other counseling recommendations.  Return in about 2 weeks (around 10/25/2017).  No future appointments.  Thressa Sheller, CNM

## 2017-10-12 LAB — HERPES SIMPLEX VIRUS(HSV) DNA BY PCR
HSV 2 DNA: NEGATIVE
HSV-1 DNA: NEGATIVE

## 2017-10-25 ENCOUNTER — Other Ambulatory Visit: Payer: Self-pay | Admitting: *Deleted

## 2017-10-25 DIAGNOSIS — Z3480 Encounter for supervision of other normal pregnancy, unspecified trimester: Secondary | ICD-10-CM

## 2017-10-25 IMAGING — US US OB COMP LESS 14 WK
1 series · 16 of 22 positions shown · non-contrast
Comparison: 11/30/2016

CLINICAL DATA: Current assigned gestational age of 13 weeks 1 day.
No audible fetal Doppler heart tones.

EXAM:
OBSTETRIC <14 WK ULTRASOUND
TECHNIQUE: Transabdominal ultrasound was performed for evaluation of the
gestation as well as the maternal uterus and adnexal regions.

[Series 1: us ob comp less 14 wk · 22 acquisitions, 16 frames shown]
[im 1/22]
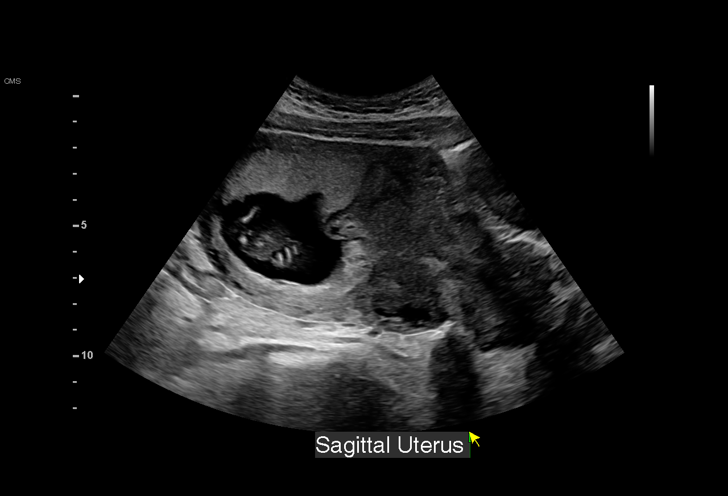
[im 3/22]
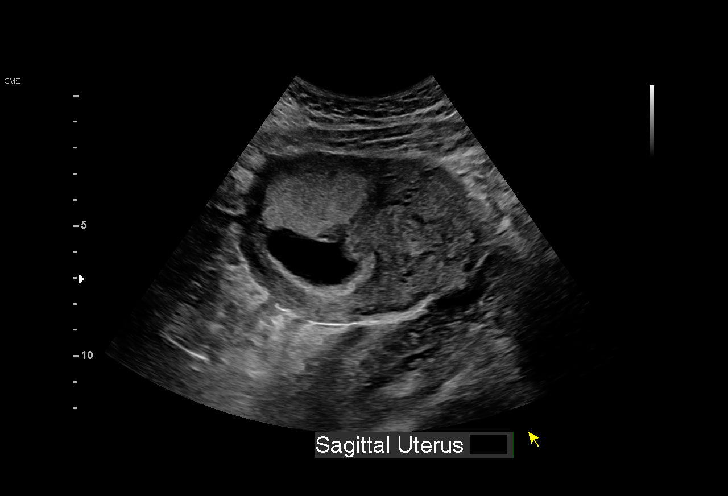
[im 4/22]
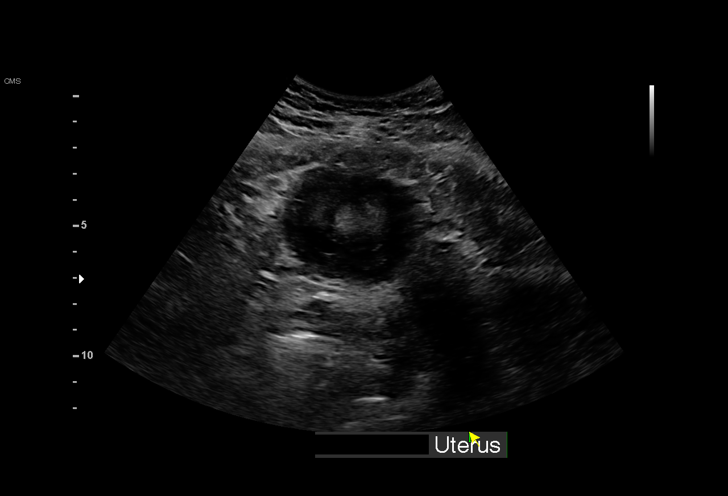
[im 5/22]
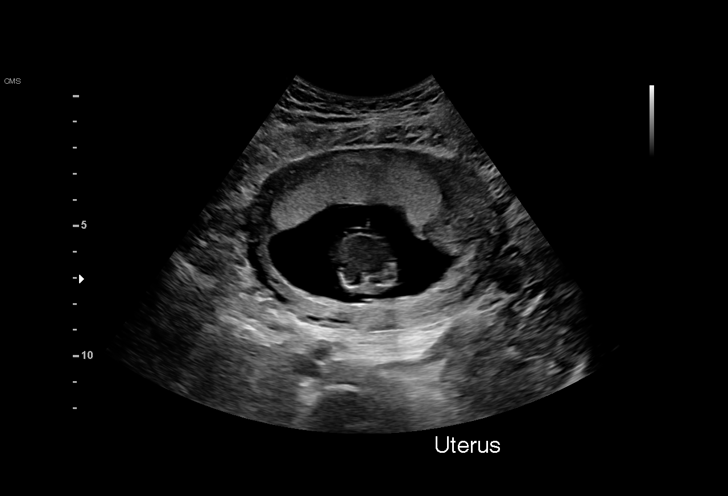
[im 7/22]
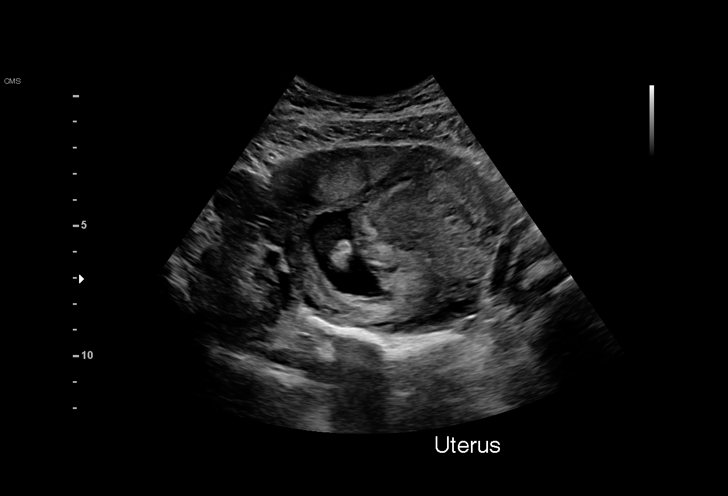
[im 8/22]
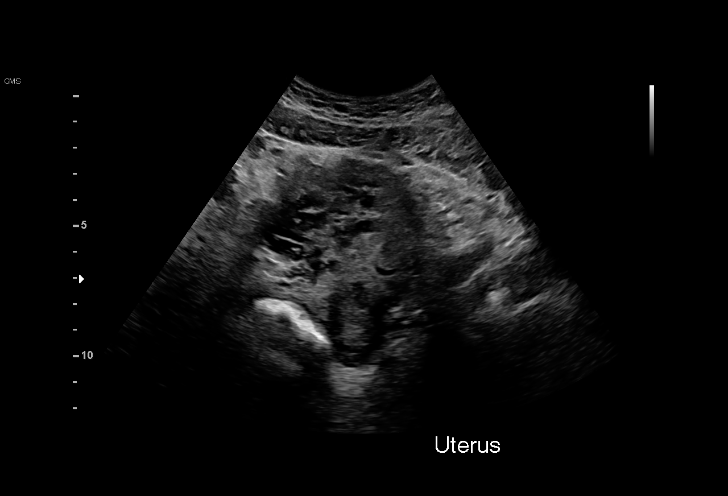
[im 9/22]
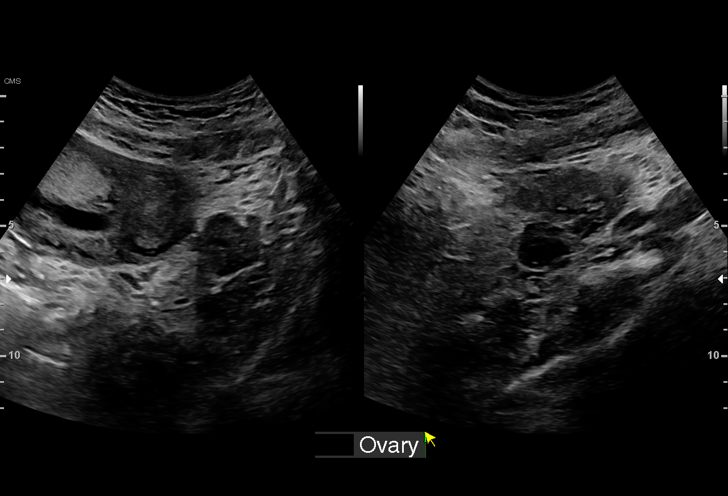
[im 11/22]
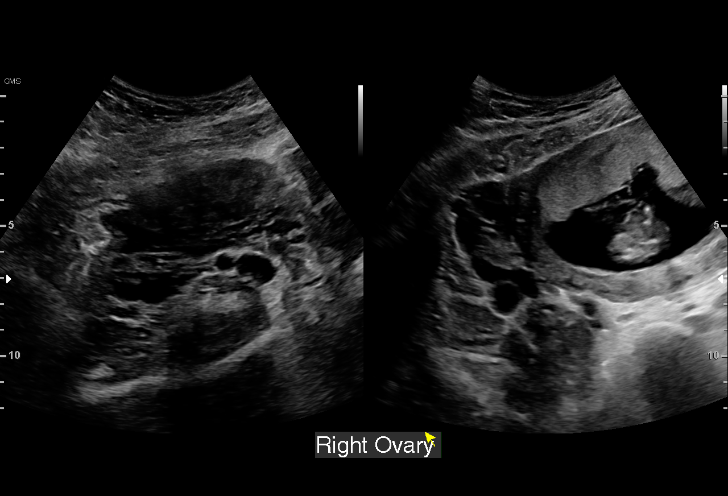
[im 12/22]
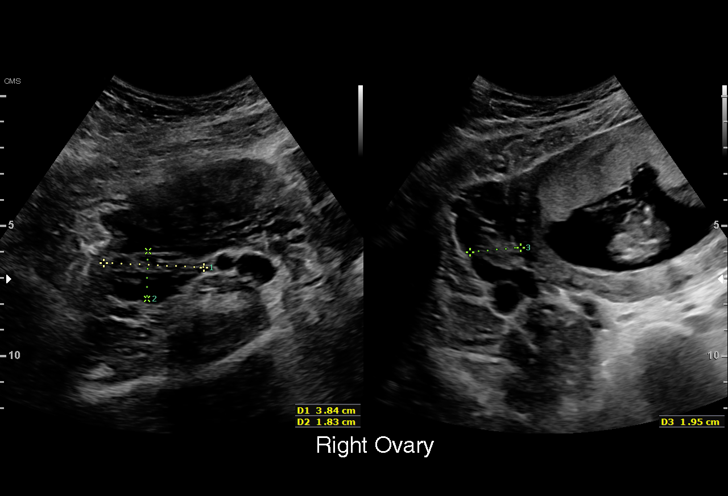
[im 14/22]
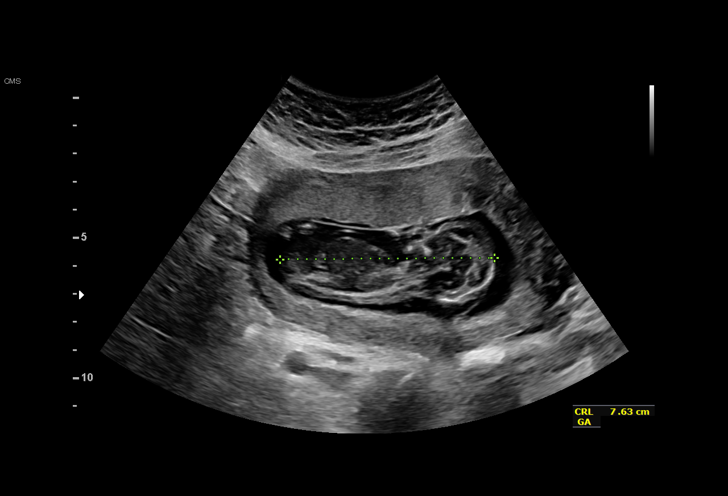
[im 15/22]
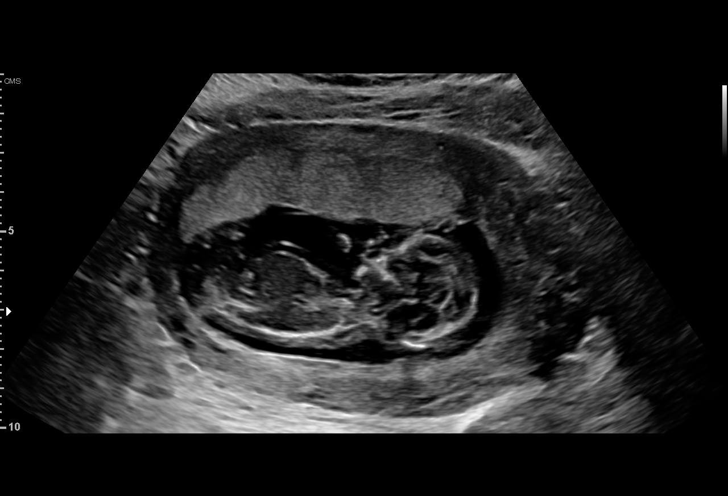
[im 16/22]
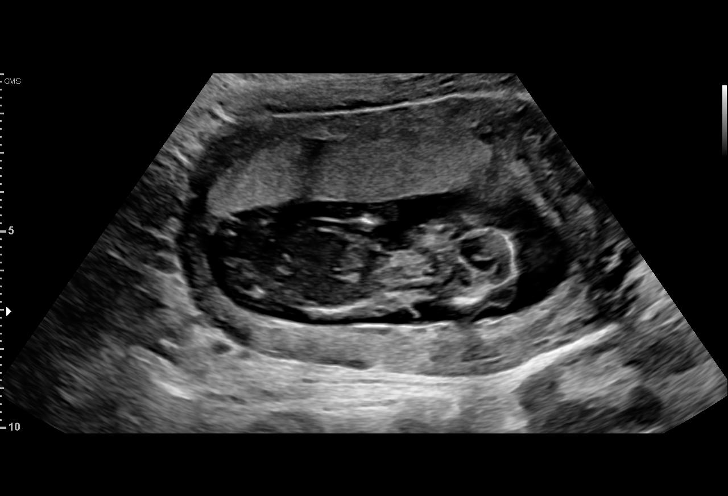
[im 18/22]
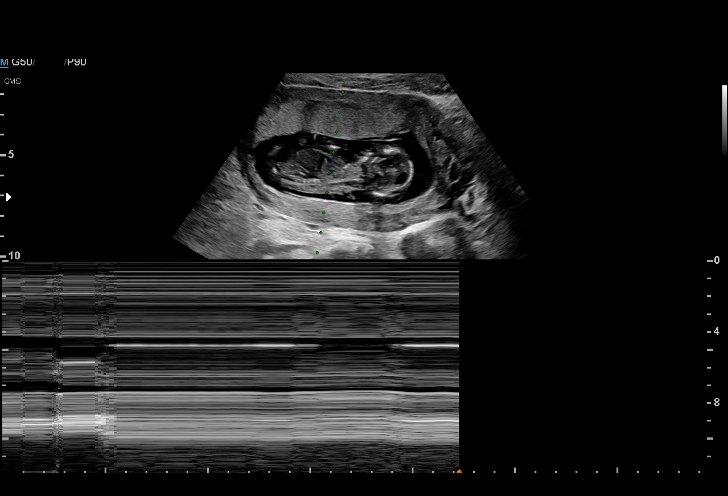
[im 19/22]
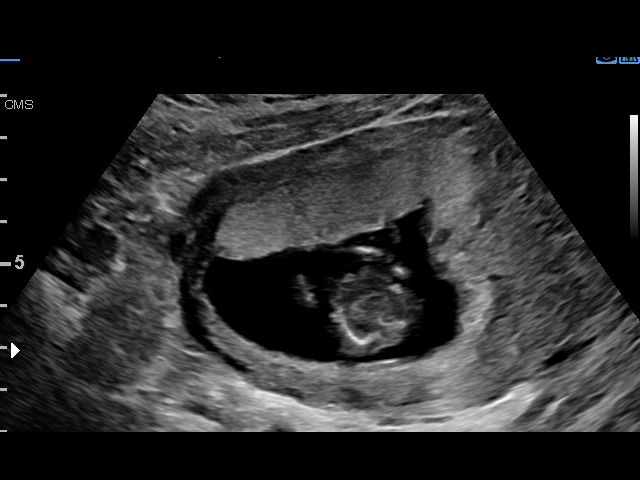
[im 20/22]
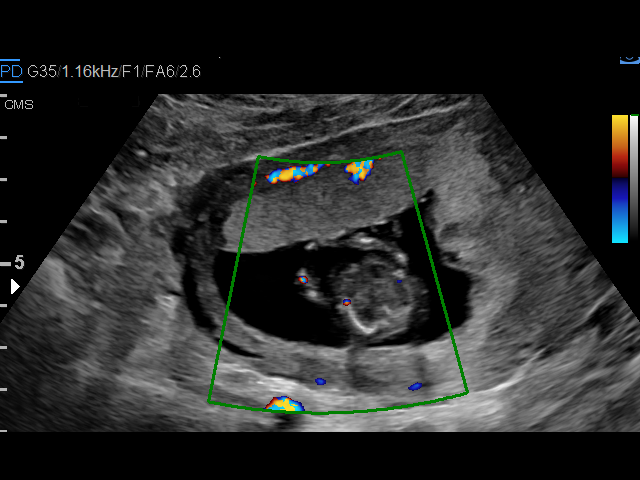
[im 22/22]
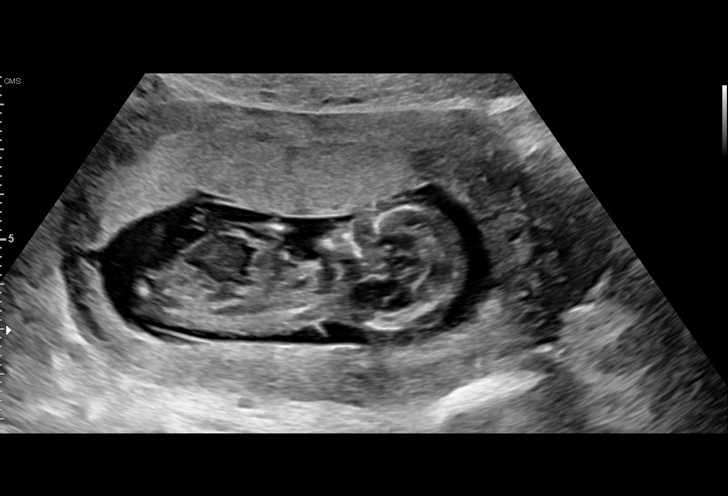

[16 of 22 positions shown; findings below may reference images not displayed]

FINDINGS: Intrauterine gestational sac: Single

Yolk sac:  Not Visualized.

Embryo:  Visualized.

Cardiac Activity: Absent

CRL:   77  mm   13 w 5 d                  US EDC: 06/24/2017

Subchorionic hemorrhage:  None visualized.

Maternal uterus/adnexae: No mass or free fluid identified.
IMPRESSION: No fetal cardiac activity, consistent with failed IUP.

## 2017-10-26 ENCOUNTER — Other Ambulatory Visit: Payer: Medicaid Other

## 2017-10-26 ENCOUNTER — Ambulatory Visit (INDEPENDENT_AMBULATORY_CARE_PROVIDER_SITE_OTHER): Payer: Medicaid Other | Admitting: Student

## 2017-10-26 VITALS — BP 107/73 | HR 105 | Wt 163.8 lb

## 2017-10-26 DIAGNOSIS — Z3480 Encounter for supervision of other normal pregnancy, unspecified trimester: Secondary | ICD-10-CM

## 2017-10-26 DIAGNOSIS — Z3483 Encounter for supervision of other normal pregnancy, third trimester: Secondary | ICD-10-CM

## 2017-10-26 DIAGNOSIS — Z23 Encounter for immunization: Secondary | ICD-10-CM

## 2017-10-26 MED ORDER — TETANUS-DIPHTH-ACELL PERTUSSIS 5-2.5-18.5 LF-MCG/0.5 IM SUSP
0.5000 mL | Freq: Once | INTRAMUSCULAR | Status: AC
  Administered 2017-10-26: 0.5 mL via INTRAMUSCULAR

## 2017-10-26 NOTE — Patient Instructions (Signed)

## 2017-10-26 NOTE — Progress Notes (Signed)
   PRENATAL VISIT NOTE  Subjective:  Christina Jennings is a 27 y.o. G4P1021 at [redacted]w[redacted]d being seen today for ongoing prenatal care.  She is currently monitored for the following issues for this low-risk pregnancy and has Supervision of other normal pregnancy, antepartum; Late prenatal care affecting pregnancy; Anemia affecting pregnancy, antepartum; and UTI (urinary tract infection) during pregnancy, second trimester on their problem list.  Patient reports no complaints.  Contractions: Not present. Vag. Bleeding: None.  Movement: Present. Denies leaking of fluid.   The following portions of the patient's history were reviewed and updated as appropriate: allergies, current medications, past family history, past medical history, past social history, past surgical history and problem list. Problem list updated.  Objective:   Vitals:   10/26/17 1040  BP: 107/73  Pulse: (!) 105  Weight: 163 lb 12.8 oz (74.3 kg)    Fetal Status: Fetal Heart Rate (bpm): 158 Fundal Height: 29 cm Movement: Present     General:  Alert, oriented and cooperative. Patient is in no acute distress.  Skin: Skin is warm and dry. No rash noted.   Cardiovascular: Normal heart rate noted  Respiratory: Normal respiratory effort, no problems with respiration noted  Abdomen: Soft, gravid, appropriate for gestational age.  Pain/Pressure: Present     Pelvic: Cervical exam deferred        Extremities: Normal range of motion.  Edema: Trace  Mental Status: Normal mood and affect. Normal behavior. Normal judgment and thought content.   Assessment and Plan:  Pregnancy: G4P1021 at [redacted]w[redacted]d  1. Supervision of other normal pregnancy, antepartum - doing well. 28 wk labs collected today -Pt undecided about contraception but does have a list.  -Considering outpatient circ, has list of prices and locations  Preterm labor symptoms and general obstetric precautions including but not limited to vaginal bleeding, contractions, leaking of fluid  and fetal movement were reviewed in detail with the patient. Please refer to After Visit Summary for other counseling recommendations.  Return in about 2 weeks (around 11/09/2017) for Routine OB.  Future Appointments  Date Time Provider Department Center  11/09/2017  3:55 PM Judeth Horn, NP [facility]    Judeth Horn, NP

## 2017-10-26 NOTE — Progress Notes (Signed)
tdap vaccine today 

## 2017-10-27 LAB — GLUCOSE TOLERANCE, 2 HOURS W/ 1HR
GLUCOSE, 1 HOUR: 132 mg/dL (ref 65–179)
Glucose, 2 hour: 115 mg/dL (ref 65–152)
Glucose, Fasting: 68 mg/dL (ref 65–91)

## 2017-10-27 LAB — CBC
HEMOGLOBIN: 10 g/dL — AB (ref 11.1–15.9)
Hematocrit: 31.2 % — ABNORMAL LOW (ref 34.0–46.6)
MCH: 26 pg — ABNORMAL LOW (ref 26.6–33.0)
MCHC: 32.1 g/dL (ref 31.5–35.7)
MCV: 81 fL (ref 79–97)
Platelets: 194 10*3/uL (ref 150–379)
RBC: 3.84 x10E6/uL (ref 3.77–5.28)
RDW: 15.3 % (ref 12.3–15.4)
WBC: 4.8 10*3/uL (ref 3.4–10.8)

## 2017-10-27 LAB — RPR: RPR Ser Ql: NONREACTIVE

## 2017-10-27 LAB — HIV ANTIBODY (ROUTINE TESTING W REFLEX): HIV Screen 4th Generation wRfx: NONREACTIVE

## 2017-10-28 ENCOUNTER — Encounter (INDEPENDENT_AMBULATORY_CARE_PROVIDER_SITE_OTHER): Payer: Self-pay

## 2017-10-28 ENCOUNTER — Other Ambulatory Visit: Payer: Self-pay | Admitting: Student

## 2017-10-28 DIAGNOSIS — O99013 Anemia complicating pregnancy, third trimester: Secondary | ICD-10-CM

## 2017-10-28 MED ORDER — FERROUS SULFATE 325 (65 FE) MG PO TABS: 325.0000 mg | ORAL_TABLET | Freq: Two times a day (BID) | ORAL | 1 refills | Status: DC

## 2017-11-09 ENCOUNTER — Ambulatory Visit (INDEPENDENT_AMBULATORY_CARE_PROVIDER_SITE_OTHER): Payer: Medicaid Other | Admitting: Student

## 2017-11-09 VITALS — BP 105/61 | HR 86 | Wt 162.9 lb

## 2017-11-09 DIAGNOSIS — O26893 Other specified pregnancy related conditions, third trimester: Secondary | ICD-10-CM

## 2017-11-09 DIAGNOSIS — R12 Heartburn: Secondary | ICD-10-CM

## 2017-11-09 DIAGNOSIS — Z3480 Encounter for supervision of other normal pregnancy, unspecified trimester: Secondary | ICD-10-CM

## 2017-11-09 MED ORDER — RANITIDINE HCL 150 MG PO TABS: 150.0000 mg | ORAL_TABLET | Freq: Every day | ORAL | 1 refills | Status: DC

## 2017-11-09 NOTE — Progress Notes (Signed)
   PRENATAL VISIT NOTE  Subjective:  Christina Jennings is a 27 y.o. G4P1021 at [redacted]w[redacted]d being seen today for ongoing prenatal care.  She is currently monitored for the following issues for this low-risk pregnancy and has Supervision of other normal pregnancy, antepartum; Late prenatal care affecting pregnancy; Anemia affecting pregnancy, antepartum; and UTI (urinary tract infection) during pregnancy, second trimester on their problem list.  Patient reports carpal tunnel symptoms and epigastric pain. Bilateral numbness in tingling in thumbs and wrists.  Epigastric pain that is worse when lying down at night. Describes as soreness. Was previously taking zantac but ran out of it a few months ago and hasn't taken it since. Denies n/v.   Contractions: Irregular. Vag. Bleeding: None.  Movement: Present. Denies leaking of fluid. Has been having braxton hicks contractions. Worse while at work. None today. Reports 2-3 per hour while at work.   The following portions of the patient's history were reviewed and updated as appropriate: allergies, current medications, past family history, past medical history, past social history, past surgical history and problem list. Problem list updated.  Objective:   Vitals:   11/09/17 1611  BP: 105/61  Pulse: 86  Weight: 162 lb 14.4 oz (73.9 kg)    Fetal Status: Fetal Heart Rate (bpm): 140 Fundal Height: 31 cm Movement: Present     General:  Alert, oriented and cooperative. Patient is in no acute distress.  Skin: Skin is warm and dry. No rash noted.   Cardiovascular: Normal heart rate noted  Respiratory: Normal respiratory effort, no problems with respiration noted  Abdomen: Soft, gravid, appropriate for gestational age.  Pain/Pressure: Present     Pelvic: Cervical exam performed Dilation: Closed Effacement (%): 0 Station: -3  Extremities: Normal range of motion.  Edema: Trace  Mental Status: Normal mood and affect. Normal behavior. Normal judgment and thought content.    Assessment and Plan:  Pregnancy: G4P1021 at [redacted]w[redacted]d  1. Supervision of other normal pregnancy, antepartum   2. Heartburn during pregnancy in third trimester  - ranitidine (ZANTAC) 150 MG tablet; Take 1 tablet (150 mg total) by mouth at bedtime.  Dispense: 30 tablet; Refill: 1  Preterm labor symptoms and general obstetric precautions including but not limited to vaginal bleeding, contractions, leaking of fluid and fetal movement were reviewed in detail with the patient. Please refer to After Visit Summary for other counseling recommendations.  Return in about 2 weeks (around 11/23/2017) for Routine OB.  Future Appointments  Date Time Provider Department Center  11/24/2017  3:35 PM Burleson, Brand Males, NP [facility]    Judeth Horn, NP

## 2017-11-09 NOTE — Progress Notes (Signed)
C/o numbness in hands. C/o pain under her ribs in epigastric area. C/o intermittent  braxton hicks contractions all day that don't hurt - maybe 2 or 3 an hour. Declines flu shot.

## 2017-11-09 NOTE — Patient Instructions (Addendum)
Anemia Anemia is a condition in which you do not have enough red blood cells or hemoglobin. Hemoglobin is a substance in red blood cells that carries oxygen. When you do not have enough red blood cells or hemoglobin (are anemic), your body cannot get enough oxygen and your organs may not work properly. As a result, you may feel very tired or have other problems. What are the causes? Common causes of anemia include:  Excessive bleeding. Anemia can be caused by excessive bleeding inside or outside the body, including bleeding from the intestine or from periods in women.  Poor nutrition.  Long-lasting (chronic) kidney, thyroid, and liver disease.  Bone marrow disorders.  Cancer and treatments for cancer.  HIV (human immunodeficiency virus) and AIDS (acquired immunodeficiency syndrome).  Treatments for HIV and AIDS.  Spleen problems.  Blood disorders.  Infections, medicines, and autoimmune disorders that destroy red blood cells.  What are the signs or symptoms? Symptoms of this condition include:  Minor weakness.  Dizziness.  Headache.  Feeling heartbeats that are irregular or faster than normal (palpitations).  Shortness of breath, especially with exercise.  Paleness.  Cold sensitivity.  Indigestion.  Nausea.  Difficulty sleeping.  Difficulty concentrating.  Symptoms may occur suddenly or develop slowly. If your anemia is mild, you may not have symptoms. How is this diagnosed? This condition is diagnosed based on:  Blood tests.  Your medical history.  A physical exam.  Bone marrow biopsy.  Your health care provider may also check your stool (feces) for blood and may do additional testing to look for the cause of your bleeding. You may also have other tests, including:  Imaging tests, such as a CT scan or MRI.  Endoscopy.  Colonoscopy.  How is this treated? Treatment for this condition depends on the cause. If you continue to lose a lot of blood,  you may need to be treated at a hospital. Treatment may include:  Taking supplements of iron, vitamin B12, or folic acid.  Taking a hormone medicine (erythropoietin) that can help to stimulate red blood cell growth.  Having a blood transfusion. This may be needed if you lose a lot of blood.  Making changes to your diet.  Having surgery to remove your spleen.  Follow these instructions at home:  Take over-the-counter and prescription medicines only as told by your health care provider.  Take supplements only as told by your health care provider.  Follow any diet instructions that you were given.  Keep all follow-up visits as told by your health care provider. This is important. Contact a health care provider if:  You develop new bleeding anywhere in the body. Get help right away if:  You are very weak.  You are short of breath.  You have pain in your abdomen or chest.  You are dizzy or feel faint.  You have trouble concentrating.  You have bloody or black, tarry stools.  You vomit repeatedly or you vomit up blood. Summary  Anemia is a condition in which you do not have enough red blood cells or enough of a substance in your red blood cells that carries oxygen (hemoglobin).  Symptoms may occur suddenly or develop slowly.  If your anemia is mild, you may not have symptoms.  This condition is diagnosed with blood tests as well as a medical history and physical exam. Other tests may be needed.  Treatment for this condition depends on the cause of the anemia. This information is not intended to replace advice   given to you by your health care provider. Make sure you discuss any questions you have with your health care provider. Document Released: 07/07/2004 Document Revised: 07/01/2016 Document Reviewed: 07/01/2016 Elsevier Interactive Patient Education  2018 [place] Syndrome Carpal tunnel syndrome is a condition that causes pain in your hand and  arm. The carpal tunnel is a narrow area located on the palm side of your wrist. Repeated wrist motion or certain diseases may cause swelling within the tunnel. This swelling pinches the main nerve in the wrist (median nerve). What are the causes? This condition may be caused by:  Repeated wrist motions.  Wrist injuries.  Arthritis.  A cyst or tumor in the carpal tunnel.  Fluid buildup during pregnancy.  Sometimes the cause of this condition is not known. What increases the risk? This condition is more likely to develop in:  People who have jobs that cause them to repeatedly move their wrists in the same motion, such as Art gallery manager.  Women.  People with certain conditions, such as: ? Diabetes. ? Obesity. ? An underactive thyroid (hypothyroidism). ? Kidney failure.  What are the signs or symptoms? Symptoms of this condition include:  A tingling feeling in your fingers, especially in your thumb, index, and middle fingers.  Tingling or numbness in your hand.  An aching feeling in your entire arm, especially when your wrist and elbow are bent for long periods of time.  Wrist pain that goes up your arm to your shoulder.  Pain that goes down into your palm or fingers.  A weak feeling in your hands. You may have trouble grabbing and holding items.  Your symptoms may feel worse during the night. How is this diagnosed? This condition is diagnosed with a medical history and physical exam. You may also have tests, including:  An electromyogram (EMG). This test measures electrical signals sent by your nerves into the muscles.  X-rays.  How is this treated? Treatment for this condition includes:  Lifestyle changes. It is important to stop doing or modify the activity that caused your condition.  Physical or occupational therapy.  Medicines for pain and inflammation. This may include medicine that is injected into your wrist.  A wrist  splint.  Surgery.  Follow these instructions at home: If you have a splint:  Wear it as told by your health care provider. Remove it only as told by your health care provider.  Loosen the splint if your fingers become numb and tingle, or if they turn cold and blue.  Keep the splint clean and dry. General instructions  Take over-the-counter and prescription medicines only as told by your health care provider.  Rest your wrist from any activity that may be causing your pain. If your condition is work related, talk to your employer about changes that can be made, such as getting a wrist pad to use while typing.  If directed, apply ice to the painful area: ? Put ice in a plastic bag. ? Place a towel between your skin and the bag. ? Leave the ice on for 20 minutes, 2-3 times per day.  Keep all follow-up visits as told by your health care provider. This is important.  Do any exercises as told by your health care provider, physical therapist, or occupational therapist. Contact a health care provider if:  You have new symptoms.  Your pain is not controlled with medicines.  Your symptoms get worse. This information is not intended to replace advice given  to you by your health care provider. Make sure you discuss any questions you have with your health care provider. Document Released: 05/27/2000 Document Revised: 10/08/2015 Document Reviewed: 10/15/2014 Elsevier Interactive Patient Education  [employer].

## 2017-11-24 ENCOUNTER — Ambulatory Visit (INDEPENDENT_AMBULATORY_CARE_PROVIDER_SITE_OTHER): Payer: Medicaid Other | Admitting: Nurse Practitioner

## 2017-11-24 VITALS — BP 126/72 | HR 104 | Wt 166.4 lb

## 2017-11-24 DIAGNOSIS — O26843 Uterine size-date discrepancy, third trimester: Secondary | ICD-10-CM

## 2017-11-24 DIAGNOSIS — Z3480 Encounter for supervision of other normal pregnancy, unspecified trimester: Secondary | ICD-10-CM

## 2017-11-24 DIAGNOSIS — Z3483 Encounter for supervision of other normal pregnancy, third trimester: Secondary | ICD-10-CM

## 2017-11-24 MED ORDER — COMFORT FIT MATERNITY SUPP MED MISC: 0 refills | Status: DC

## 2017-11-24 NOTE — Patient Instructions (Signed)

## 2017-11-24 NOTE — Progress Notes (Signed)
    Subjective:  Christina Jennings is a 27 y.o. G4P1021 at 7358w2d being seen today for ongoing prenatal care.  She is currently monitored for the following issues for this low-risk pregnancy and has Supervision of other normal pregnancy, antepartum; Late prenatal care affecting pregnancy; Anemia affecting pregnancy, antepartum; and UTI (urinary tract infection) during pregnancy, second trimester on their problem list.  Patient reports ribs hurt at night and having difficulty sleeping.  Contractions: Irregular. Vag. Bleeding: None.  Movement: Present. Denies leaking of fluid.   The following portions of the patient's history were reviewed and updated as appropriate: allergies, current medications, past family history, past medical history, past social history, past surgical history and problem list. Problem list updated.  Objective:   Vitals:   11/24/17 1544  BP: 126/72  Pulse: (!) 104  Weight: 166 lb 6.4 oz (75.5 kg)    Fetal Status: Fetal Heart Rate (bpm): 140 Fundal Height: 36 cm Movement: Present     General:  Alert, oriented and cooperative. Patient is in no acute distress.  Skin: Skin is warm and dry. No rash noted.   Cardiovascular: Normal heart rate noted  Respiratory: Normal respiratory effort, no problems with respiration noted  Abdomen: Soft, gravid, appropriate for gestational age. Pain/Pressure: Present     Pelvic:  Cervical exam deferred        Extremities: Normal range of motion.  Edema: Trace  Mental Status: Normal mood and affect. Normal behavior. Normal judgment and thought content.   Urinalysis:      Assessment and Plan:  Pregnancy: G4P1021 at 3058w2d  1. Supervision of other normal pregnancy, antepartum Full of baby and measuring size > dates.  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   Preterm labor  symptoms and general obstetric precautions including but not limited to vaginal bleeding, contractions, leaking of fluid and fetal movement were reviewed in detail with the patient. Please refer to After Visit Summary for other counseling recommendations.  Return in about 2 weeks (around 12/08/2017).  Nolene BernheimERRI Danica Camarena, RN, MSN, NP-BC Nurse Practitioner, [facility], [facility] 11/24/2017 4:07 PM

## 2017-12-04 ENCOUNTER — Other Ambulatory Visit: Payer: Self-pay | Admitting: Nurse Practitioner

## 2017-12-04 ENCOUNTER — Ambulatory Visit (HOSPITAL_COMMUNITY)
Admission: RE | Admit: 2017-12-04 | Discharge: 2017-12-04 | Disposition: A | Discharge status: Home / Self Care | Payer: Medicaid Other | Source: Ambulatory Visit | Attending: Nurse Practitioner | Admitting: Nurse Practitioner

## 2017-12-04 ENCOUNTER — Encounter: Payer: Self-pay | Admitting: Student

## 2017-12-04 DIAGNOSIS — Z3A33 33 weeks gestation of pregnancy: Secondary | ICD-10-CM | POA: Insufficient documentation

## 2017-12-04 DIAGNOSIS — O26843 Uterine size-date discrepancy, third trimester: Secondary | ICD-10-CM | POA: Insufficient documentation

## 2017-12-04 DIAGNOSIS — Z362 Encounter for other antenatal screening follow-up: Secondary | ICD-10-CM

## 2017-12-04 DIAGNOSIS — O403XX0 Polyhydramnios, third trimester, not applicable or unspecified: Secondary | ICD-10-CM

## 2017-12-05 ENCOUNTER — Encounter: Payer: Self-pay | Admitting: Nurse Practitioner

## 2017-12-05 DIAGNOSIS — O403XX0 Polyhydramnios, third trimester, not applicable or unspecified: Secondary | ICD-10-CM | POA: Insufficient documentation

## 2017-12-07 ENCOUNTER — Encounter: Payer: Medicaid Other | Admitting: Student

## 2017-12-07 ENCOUNTER — Ambulatory Visit (INDEPENDENT_AMBULATORY_CARE_PROVIDER_SITE_OTHER): Payer: Medicaid Other | Admitting: Obstetrics & Gynecology

## 2017-12-07 VITALS — BP 122/73 | HR 113 | Wt 166.5 lb

## 2017-12-07 DIAGNOSIS — O0933 Supervision of pregnancy with insufficient antenatal care, third trimester: Secondary | ICD-10-CM

## 2017-12-07 DIAGNOSIS — Z3480 Encounter for supervision of other normal pregnancy, unspecified trimester: Secondary | ICD-10-CM

## 2017-12-07 DIAGNOSIS — O403XX0 Polyhydramnios, third trimester, not applicable or unspecified: Secondary | ICD-10-CM

## 2017-12-07 NOTE — Progress Notes (Signed)
   PRENATAL VISIT NOTE  Subjective:  Christina Jennings is a 27 y.o. G4P1021 at 4366w1d being seen today for ongoing prenatal care.  She is currently monitored for the following issues for this high-risk pregnancy and has Supervision of other normal pregnancy, antepartum; Late prenatal care affecting pregnancy; Anemia affecting pregnancy, antepartum; UTI (urinary tract infection) during pregnancy, second trimester; and Polyhydramnios affecting pregnancy in third trimester on their problem list.  Patient reports no complaints.  Contractions: Irregular. Vag. Bleeding: None.  Movement: Present. Denies leaking of fluid.   The following portions of the patient's history were reviewed and updated as appropriate: allergies, current medications, past family history, past medical history, past social history, past surgical history and problem list. Problem list updated.  Objective:   Vitals:   12/07/17 1026  BP: 122/73  Pulse: (!) 113  Weight: 166 lb 8 oz (75.5 kg)    Fetal Status: Fetal Heart Rate (bpm): 139   Movement: Present     General:  Alert, oriented and cooperative. Patient is in no acute distress.  Skin: Skin is warm and dry. No rash noted.   Cardiovascular: Normal heart rate noted  Respiratory: Normal respiratory effort, no problems with respiration noted  Abdomen: Soft, gravid, appropriate for gestational age.  Pain/Pressure: Present     Pelvic: Cervical exam deferred        Extremities: Normal range of motion.  Edema: Trace  Mental Status: Normal mood and affect. Normal behavior. Normal judgment and thought content.   Assessment and Plan:  Pregnancy: G4P1021 at 4366w1d  1. Supervision of other normal pregnancy, antepartum   2. Polyhydramnios affecting pregnancy in third trimester   3. Late prenatal care affecting pregnancy in third trimester   Preterm labor symptoms and general obstetric precautions including but not limited to vaginal bleeding, contractions, leaking of fluid and  fetal movement were reviewed in detail with the patient. Please refer to After Visit Summary for other counseling recommendations.  No follow-ups on file.  No future appointments.  Allie BossierMyra C Arely Tinner, MD

## 2017-12-08 LAB — HEMOGLOBIN A1C
Est. average glucose Bld gHb Est-mCnc: 105 mg/dL
HEMOGLOBIN A1C: 5.3 % (ref 4.8–5.6)

## 2017-12-11 ENCOUNTER — Ambulatory Visit: Payer: Self-pay

## 2017-12-11 ENCOUNTER — Ambulatory Visit (INDEPENDENT_AMBULATORY_CARE_PROVIDER_SITE_OTHER): Payer: Medicaid Other | Admitting: *Deleted

## 2017-12-11 VITALS — BP 119/74 | HR 110 | Wt 169.0 lb

## 2017-12-11 DIAGNOSIS — O403XX0 Polyhydramnios, third trimester, not applicable or unspecified: Secondary | ICD-10-CM | POA: Diagnosis not present

## 2017-12-11 NOTE — Progress Notes (Signed)

## 2017-12-18 ENCOUNTER — Other Ambulatory Visit (HOSPITAL_COMMUNITY)
Admission: RE | Admit: 2017-12-18 | Discharge: 2017-12-18 | Disposition: A | Discharge status: Home / Self Care | Payer: Medicaid Other | Source: Ambulatory Visit | Attending: Advanced Practice Midwife | Admitting: Advanced Practice Midwife

## 2017-12-18 ENCOUNTER — Other Ambulatory Visit: Payer: Self-pay | Admitting: Advanced Practice Midwife

## 2017-12-18 ENCOUNTER — Ambulatory Visit: Payer: Self-pay

## 2017-12-18 ENCOUNTER — Encounter: Payer: Self-pay | Admitting: Advanced Practice Midwife

## 2017-12-18 ENCOUNTER — Ambulatory Visit (INDEPENDENT_AMBULATORY_CARE_PROVIDER_SITE_OTHER): Payer: Medicaid Other | Admitting: *Deleted

## 2017-12-18 ENCOUNTER — Ambulatory Visit (INDEPENDENT_AMBULATORY_CARE_PROVIDER_SITE_OTHER): Payer: Medicaid Other | Admitting: Advanced Practice Midwife

## 2017-12-18 VITALS — BP 98/64 | HR 99 | Wt 168.3 lb

## 2017-12-18 DIAGNOSIS — Z3480 Encounter for supervision of other normal pregnancy, unspecified trimester: Secondary | ICD-10-CM | POA: Diagnosis present

## 2017-12-18 DIAGNOSIS — Z3483 Encounter for supervision of other normal pregnancy, third trimester: Secondary | ICD-10-CM

## 2017-12-18 DIAGNOSIS — O403XX0 Polyhydramnios, third trimester, not applicable or unspecified: Secondary | ICD-10-CM

## 2017-12-18 LAB — OB RESULTS CONSOLE GBS: STREP GROUP B AG: NEGATIVE

## 2017-12-18 NOTE — Progress Notes (Signed)
Pt informed that the ultrasound is considered a limited OB ultrasound and is not intended to be a complete ultrasound exam.  Patient also informed that the ultrasound is not being completed with the intent of assessing for fetal or placental anomalies or any pelvic abnormalities.  Explained that the purpose of today's ultrasound is to assess for presentation, BPP and amniotic fluid volume.  Patient acknowledges the purpose of the exam and the limitations of the study.    US for growth and BPP scheduled on 7/23 @ [facility]

## 2017-12-18 NOTE — Patient Instructions (Signed)

## 2017-12-18 NOTE — Progress Notes (Signed)
   PRENATAL VISIT NOTE  Subjective:  Christina Jennings is a 27 y.o. G4P1021 at 7114w5d being seen today for ongoing prenatal care.  She is currently monitored for the following issues for this high-risk pregnancy and has Supervision of other normal pregnancy, antepartum; Late prenatal care affecting pregnancy; Anemia affecting pregnancy, antepartum; UTI (urinary tract infection) during pregnancy, second trimester; and Polyhydramnios affecting pregnancy in third trimester on their problem list.  Patient reports no complaints.  Contractions: Irritability. Vag. Bleeding: None.  Movement: Present. Denies leaking of fluid.   The following portions of the patient's history were reviewed and updated as appropriate: allergies, current medications, past family history, past medical history, past social history, past surgical history and problem list. Problem list updated.  Objective:   Vitals:   12/18/17 1336  BP: 98/64  Pulse: 99  Weight: 148 lb 3.2 oz (67.2 kg)    Fetal Status: Fetal Heart Rate (bpm): 134 Fundal Height: 37 cm Movement: Present     General:  Alert, oriented and cooperative. Patient is in no acute distress.  Skin: Skin is warm and dry. No rash noted.   Cardiovascular: Normal heart rate noted  Respiratory: Normal respiratory effort, no problems with respiration noted  Abdomen: Soft, gravid, appropriate for gestational age.  Pain/Pressure: Present     Pelvic: Cervical exam performed Dilation: 2 Effacement (%): 60 Station: -2  Extremities: Normal range of motion.  Edema: Trace  Mental Status: Normal mood and affect. Normal behavior. Normal judgment and thought content.    BPP: 10/10  NST:  Baseline: 135 Variability: moderate Accels: 15x15 Decels: none Toco: rare  Assessment and Plan:  Pregnancy: G4P1021 at 3814w5d  1. Supervision of other normal pregnancy, antepartum - Culture, beta strep (group b only) - Cervicovaginal ancillary only  2. Polyhydramnios affecting pregnancy  in third trimester - Weekly BPP/NST   Preterm labor symptoms and general obstetric precautions including but not limited to vaginal bleeding, contractions, leaking of fluid and fetal movement were reviewed in detail with the patient. Please refer to After Visit Summary for other counseling recommendations.  [facility] (around [facility]/2017[facility].  Future Appointments  Date Time Provider Department Center  01/02/2018  8:30 AM [facility] US 5 [facility]    Thressa ShellerHeather Hogan, CNM

## 2017-12-19 LAB — CERVICOVAGINAL ANCILLARY ONLY
Chlamydia: NEGATIVE
Neisseria Gonorrhea: NEGATIVE

## 2017-12-22 LAB — CULTURE, BETA STREP (GROUP B ONLY): Strep Gp B Culture: NEGATIVE

## 2018-01-02 ENCOUNTER — Encounter: Payer: Self-pay | Admitting: Obstetrics and Gynecology

## 2018-01-02 ENCOUNTER — Other Ambulatory Visit: Payer: Self-pay | Admitting: Advanced Practice Midwife

## 2018-01-02 ENCOUNTER — Ambulatory Visit (INDEPENDENT_AMBULATORY_CARE_PROVIDER_SITE_OTHER): Payer: Medicaid Other | Admitting: *Deleted

## 2018-01-02 ENCOUNTER — Ambulatory Visit (HOSPITAL_COMMUNITY)
Admission: RE | Admit: 2018-01-02 | Discharge: 2018-01-02 | Disposition: A | Discharge status: Home / Self Care | Payer: Medicaid Other | Source: Ambulatory Visit | Attending: Advanced Practice Midwife | Admitting: Advanced Practice Midwife

## 2018-01-02 ENCOUNTER — Ambulatory Visit (INDEPENDENT_AMBULATORY_CARE_PROVIDER_SITE_OTHER): Payer: Medicaid Other | Admitting: Obstetrics and Gynecology

## 2018-01-02 VITALS — BP 105/57 | HR 92 | Wt 170.5 lb

## 2018-01-02 DIAGNOSIS — O0993 Supervision of high risk pregnancy, unspecified, third trimester: Secondary | ICD-10-CM

## 2018-01-02 DIAGNOSIS — Z3A37 37 weeks gestation of pregnancy: Secondary | ICD-10-CM | POA: Diagnosis not present

## 2018-01-02 DIAGNOSIS — O403XX0 Polyhydramnios, third trimester, not applicable or unspecified: Secondary | ICD-10-CM | POA: Diagnosis not present

## 2018-01-02 DIAGNOSIS — O403XX1 Polyhydramnios, third trimester, fetus 1: Secondary | ICD-10-CM

## 2018-01-02 DIAGNOSIS — Z3480 Encounter for supervision of other normal pregnancy, unspecified trimester: Secondary | ICD-10-CM

## 2018-01-02 LAB — FETAL NONSTRESS TEST

## 2018-01-02 NOTE — Patient Instructions (Signed)
Vaginal Delivery Vaginal delivery means that you will give birth by pushing your baby out of your birth canal (vagina). A team of health care providers will help you before, during, and after vaginal delivery. Birth experiences are unique for every woman and every pregnancy, and birth experiences vary depending on where you choose to give birth. What should I do to prepare for my baby's birth? Before your baby is born, it is important to talk with your health care provider about:  Your labor and delivery preferences. These may include: ? Medicines that you may be given. ? How you will manage your pain. This might include non-medical pain relief techniques or injectable pain relief such as epidural analgesia. ? How you and your baby will be monitored during labor and delivery. ? Who may be in the labor and delivery room with you. ? Your feelings about surgical delivery of your baby (cesarean delivery, or C-section) if this becomes necessary. ? Your feelings about receiving donated blood through an IV tube (blood transfusion) if this becomes necessary.  Whether you are able: ? To take pictures or videos of the birth. ? To eat during labor and delivery. ? To move around, walk, or change positions during labor and delivery.  What to expect after your baby is born, such as: ? Whether delayed umbilical cord clamping and cutting is offered. ? Who will care for your baby right after birth. ? Medicines or tests that may be recommended for your baby. ? Whether breastfeeding is supported in your hospital or birth center. ? How long you will be in the hospital or birth center.  How any medical conditions you have may affect your baby or your labor and delivery experience.  To prepare for your baby's birth, you should also:  Attend all of your health care visits before delivery (prenatal visits) as recommended by your health care provider. This is important.  Prepare your home for your baby's  arrival. Make sure that you have: ? Diapers. ? Baby clothing. ? Feeding equipment. ? Safe sleeping arrangements for you and your baby.  Install a car seat in your vehicle. Have your car seat checked by a certified car seat installer to make sure that it is installed safely.  Think about who will help you with your new baby at home for at least the first several weeks after delivery.  What can I expect when I arrive at the birth center or hospital? Once you are in labor and have been admitted into the hospital or birth center, your health care provider may:  Review your pregnancy history and any concerns you have.  Insert an IV tube into one of your veins. This is used to give you fluids and medicines.  Check your blood pressure, pulse, temperature, and heart rate (vital signs).  Check whether your bag of water (amniotic sac) has broken (ruptured).  Talk with you about your birth plan and discuss pain control options.  Monitoring Your health care provider may monitor your contractions (uterine monitoring) and your baby's heart rate (fetal monitoring). You may need to be monitored:  Often, but not continuously (intermittently).  All the time or for long periods at a time (continuously). Continuous monitoring may be needed if: ? You are taking certain medicines, such as medicine to relieve pain or make your contractions stronger. ? You have pregnancy or labor complications.  Monitoring may be done by:  Placing a special stethoscope or a handheld monitoring device on your abdomen to   check your baby's heartbeat, and feeling your abdomen for contractions. This method of monitoring does not continuously record your baby's heartbeat or your contractions.  Placing monitors on your abdomen (external monitors) to record your baby's heartbeat and the frequency and length of contractions. You may not have to wear external monitors all the time.  Placing monitors inside of your uterus  (internal monitors) to record your baby's heartbeat and the frequency, length, and strength of your contractions. ? Your health care provider may use internal monitors if he or she needs more information about the strength of your contractions or your baby's heart rate. ? Internal monitors are put in place by passing a thin, flexible wire through your vagina and into your uterus. Depending on the type of monitor, it may remain in your uterus or on your baby's head until birth. ? Your health care provider will discuss the benefits and risks of internal monitoring with you and will ask for your permission before inserting the monitors.  Telemetry. This is a type of continuous monitoring that can be done with external or internal monitors. Instead of having to stay in bed, you are able to move around during telemetry. Ask your health care provider if telemetry is an option for you.  Physical exam Your health care provider may perform a physical exam. This may include:  Checking whether your baby is positioned: ? With the head toward your vagina (head-down). This is most common. ? With the head toward the top of your uterus (head-up or breech). If your baby is in a breech position, your health care provider may try to turn your baby to a head-down position so you can deliver vaginally. If it does not seem that your baby can be born vaginally, your provider may recommend surgery to deliver your baby. In rare cases, you may be able to deliver vaginally if your baby is head-up (breech delivery). ? Lying sideways (transverse). Babies that are lying sideways cannot be delivered vaginally.  Checking your cervix to determine: ? Whether it is thinning out (effacing). ? Whether it is opening up (dilating). ? How low your baby has moved into your birth canal.  What are the three stages of labor and delivery?  Normal labor and delivery is divided into the following three stages: Stage 1  Stage 1 is the  longest stage of labor, and it can last for hours or days. Stage 1 includes: ? Early labor. This is when contractions may be irregular, or regular and mild. Generally, early labor contractions are more than 10 minutes apart. ? Active labor. This is when contractions get longer, more regular, more frequent, and more intense. ? The transition phase. This is when contractions happen very close together, are very intense, and may last longer than during any other part of labor.  Contractions generally feel mild, infrequent, and irregular at first. They get stronger, more frequent (about every 2-3 minutes), and more regular as you progress from early labor through active labor and transition.  Many women progress through stage 1 naturally, but you may need help to continue making progress. If this happens, your health care provider may talk with you about: ? Rupturing your amniotic sac if it has not ruptured yet. ? Giving you medicine to help make your contractions stronger and more frequent.  Stage 1 ends when your cervix is completely dilated to 4 inches (10 cm) and completely effaced. This happens at the end of the transition phase. Stage 2  Once   your cervix is completely effaced and dilated to 4 inches (10 cm), you may start to feel an urge to push. It is common for the body to naturally take a rest before feeling the urge to push, especially if you received an epidural or certain other pain medicines. This rest period may last for up to 1-2 hours, depending on your unique labor experience.  During stage 2, contractions are generally less painful, because pushing helps relieve contraction pain. Instead of contraction pain, you may feel stretching and burning pain, especially when the widest part of your baby's head passes through the vaginal opening (crowning).  Your health care provider will closely monitor your pushing progress and your baby's progress through the vagina during stage 2.  Your  health care provider may massage the area of skin between your vaginal opening and anus (perineum) or apply warm compresses to your perineum. This helps it stretch as the baby's head starts to crown, which can help prevent perineal tearing. ? In some cases, an incision may be made in your perineum (episiotomy) to allow the baby to pass through the vaginal opening. An episiotomy helps to make the opening of the vagina larger to allow more room for the baby to fit through.  It is very important to breathe and focus so your health care provider can control the delivery of your baby's head. Your health care provider may have you decrease the intensity of your pushing, to help prevent perineal tearing.  After delivery of your baby's head, the shoulders and the rest of the body generally deliver very quickly and without difficulty.  Once your baby is delivered, the umbilical cord may be cut right away, or this may be delayed for 1-2 minutes, depending on your baby's health. This may vary among health care providers, hospitals, and birth centers.  If you and your baby are healthy enough, your baby may be placed on your chest or abdomen to help maintain the baby's temperature and to help you bond with each other. Some mothers and babies start breastfeeding at this time. Your health care team will dry your baby and help keep your baby warm during this time.  Your baby may need immediate care if he or she: ? Showed signs of distress during labor. ? Has a medical condition. ? Was born too early (prematurely). ? Had a bowel movement before birth (meconium). ? Shows signs of difficulty transitioning from being inside the uterus to being outside of the uterus. If you are planning to breastfeed, your health care team will help you begin a feeding. Stage 3  The third stage of labor starts immediately after the birth of your baby and ends after you deliver the placenta. The placenta is an organ that develops  during pregnancy to provide oxygen and nutrients to your baby in the womb.  Delivering the placenta may require some pushing, and you may have mild contractions. Breastfeeding can stimulate contractions to help you deliver the placenta.  After the placenta is delivered, your uterus should tighten (contract) and become firm. This helps to stop bleeding in your uterus. To help your uterus contract and to control bleeding, your health care provider may: ? Give you medicine by injection, through an IV tube, by mouth, or through your rectum (rectally). ? Massage your abdomen or perform a vaginal exam to remove any blood clots that are left in your uterus. ? Empty your bladder by placing a thin, flexible tube (catheter) into your bladder. ? Encourage   you to breastfeed your baby. After labor is over, you and your baby will be monitored closely to ensure that you are both healthy until you are ready to go home. Your health care team will teach you how to care for yourself and your baby. This information is not intended to replace advice given to you by your health care provider. Make sure you discuss any questions you have with your health care provider. Document Released: 03/08/2008 Document Revised: 12/18/2015 Document Reviewed: 06/14/2015 Elsevier Interactive Patient Education  2018 [employer]  

## 2018-01-02 NOTE — Progress Notes (Signed)
US for growth/BPP done today 

## 2018-01-02 NOTE — Progress Notes (Signed)
Subjective:  Christina Jennings is a 27 y.o. G4P1021 at 9720w6d being seen today for ongoing prenatal care.  She is currently monitored for the following issues for this high-risk pregnancy and has Supervision of other normal pregnancy, antepartum; Late prenatal care affecting pregnancy; Anemia affecting pregnancy, antepartum; and Polyhydramnios affecting pregnancy in third trimester on their problem list.  Patient reports general discomforts of pregnancy.  Contractions: Irregular. Vag. Bleeding: None.  Movement: Present. Denies leaking of fluid.   The following portions of the patient's history were reviewed and updated as appropriate: allergies, current medications, past family history, past medical history, past social history, past surgical history and problem list. Problem list updated.  Objective:   Vitals:   01/02/18 1028  BP: (!) 105/57  Pulse: 92  Weight: 170 lb 8 oz (77.3 kg)    Fetal Status: Fetal Heart Rate (bpm): NST   Movement: Present     General:  Alert, oriented and cooperative. Patient is in no acute distress.  Skin: Skin is warm and dry. No rash noted.   Cardiovascular: Normal heart rate noted  Respiratory: Normal respiratory effort, no problems with respiration noted  Abdomen: Soft, gravid, appropriate for gestational age. Pain/Pressure: Present     Pelvic:  Cervical exam deferred        Extremities: Normal range of motion.     Mental Status: Normal mood and affect. Normal behavior. Normal judgment and thought content.   Urinalysis:      Assessment and Plan:  Pregnancy: G4P1021 at 7420w6d  1. Polyhydramnios affecting pregnancy in third trimester BPP 10/10 today U/S today AFI 25 EFW > 90 % BPP/NST next week IOL at 40 weeks d/t mild poly  2. Supervision of other normal pregnancy, antepartum Stable  Term labor symptoms and general obstetric precautions including but not limited to vaginal bleeding, contractions, leaking of fluid and fetal movement were reviewed in  detail with the patient. Please refer to After Visit Summary for other counseling recommendations.  Return in about 1 week (around 01/09/2018) for OB visit.   Hermina StaggersErvin, Junie Avilla L, MD

## 2018-01-03 ENCOUNTER — Encounter: Payer: Self-pay | Admitting: *Deleted

## 2018-01-04 ENCOUNTER — Encounter (HOSPITAL_COMMUNITY): Payer: Self-pay | Admitting: *Deleted

## 2018-01-04 ENCOUNTER — Telehealth (HOSPITAL_COMMUNITY): Payer: Self-pay | Admitting: *Deleted

## 2018-01-04 NOTE — Telephone Encounter (Signed)
Preadmission screen  

## 2018-01-11 ENCOUNTER — Ambulatory Visit (INDEPENDENT_AMBULATORY_CARE_PROVIDER_SITE_OTHER): Payer: Medicaid Other | Admitting: *Deleted

## 2018-01-11 ENCOUNTER — Telehealth: Payer: Self-pay

## 2018-01-11 ENCOUNTER — Ambulatory Visit: Payer: Self-pay

## 2018-01-11 ENCOUNTER — Ambulatory Visit: Payer: Medicaid Other | Admitting: Obstetrics and Gynecology

## 2018-01-11 VITALS — BP 107/66 | HR 62 | Wt 168.9 lb

## 2018-01-11 DIAGNOSIS — O403XX0 Polyhydramnios, third trimester, not applicable or unspecified: Secondary | ICD-10-CM

## 2018-01-11 DIAGNOSIS — Z3480 Encounter for supervision of other normal pregnancy, unspecified trimester: Secondary | ICD-10-CM

## 2018-01-11 NOTE — Telephone Encounter (Signed)
Called pt because she was not in Lobby for her Appt w/ Provider. She stated had emergency & left, states will come back but just not sure exactly when.

## 2018-01-11 NOTE — Progress Notes (Signed)

## 2018-01-11 NOTE — Progress Notes (Signed)
US for growth done 7/23.  IOL scheduled on 8/7.

## 2018-01-12 ENCOUNTER — Other Ambulatory Visit: Payer: Self-pay | Admitting: Advanced Practice Midwife

## 2018-01-12 ENCOUNTER — Encounter: Payer: Self-pay | Admitting: Obstetrics and Gynecology

## 2018-01-12 NOTE — Progress Notes (Signed)
Pt had to leave d/t a family emergency

## 2018-01-17 ENCOUNTER — Encounter (HOSPITAL_COMMUNITY): Payer: Self-pay | Admitting: *Deleted

## 2018-01-17 ENCOUNTER — Inpatient Hospital Stay (HOSPITAL_COMMUNITY)
Admission: AD | Admit: 2018-01-17 | Discharge: 2018-01-22 | DRG: 786 | Disposition: A | Discharge status: Home / Self Care | Payer: Medicaid Other | Attending: Obstetrics and Gynecology | Admitting: Obstetrics and Gynecology

## 2018-01-17 ENCOUNTER — Inpatient Hospital Stay (HOSPITAL_COMMUNITY): Admission: RE | Admit: 2018-01-17 | Payer: Medicaid Other | Source: Ambulatory Visit

## 2018-01-17 ENCOUNTER — Other Ambulatory Visit: Payer: Self-pay

## 2018-01-17 DIAGNOSIS — Z98891 History of uterine scar from previous surgery: Secondary | ICD-10-CM

## 2018-01-17 DIAGNOSIS — Z3A40 40 weeks gestation of pregnancy: Secondary | ICD-10-CM | POA: Diagnosis not present

## 2018-01-17 DIAGNOSIS — O324XX0 Maternal care for high head at term, not applicable or unspecified: Secondary | ICD-10-CM | POA: Diagnosis present

## 2018-01-17 DIAGNOSIS — O0933 Supervision of pregnancy with insufficient antenatal care, third trimester: Secondary | ICD-10-CM

## 2018-01-17 DIAGNOSIS — D649 Anemia, unspecified: Secondary | ICD-10-CM | POA: Diagnosis present

## 2018-01-17 DIAGNOSIS — O479 False labor, unspecified: Secondary | ICD-10-CM

## 2018-01-17 DIAGNOSIS — O411290 Chorioamnionitis, unspecified trimester, not applicable or unspecified: Secondary | ICD-10-CM | POA: Diagnosis present

## 2018-01-17 DIAGNOSIS — O0930 Supervision of pregnancy with insufficient antenatal care, unspecified trimester: Secondary | ICD-10-CM

## 2018-01-17 DIAGNOSIS — O403XX0 Polyhydramnios, third trimester, not applicable or unspecified: Secondary | ICD-10-CM

## 2018-01-17 DIAGNOSIS — O99019 Anemia complicating pregnancy, unspecified trimester: Secondary | ICD-10-CM

## 2018-01-17 DIAGNOSIS — Z3480 Encounter for supervision of other normal pregnancy, unspecified trimester: Secondary | ICD-10-CM

## 2018-01-17 DIAGNOSIS — O411230 Chorioamnionitis, third trimester, not applicable or unspecified: Secondary | ICD-10-CM | POA: Diagnosis present

## 2018-01-17 DIAGNOSIS — O9902 Anemia complicating childbirth: Secondary | ICD-10-CM | POA: Diagnosis present

## 2018-01-17 HISTORY — DX: Polyhydramnios, third trimester, not applicable or unspecified: O40.3XX0

## 2018-01-17 LAB — CBC
HCT: 33.7 % — ABNORMAL LOW (ref 36.0–46.0)
Hemoglobin: 11 g/dL — ABNORMAL LOW (ref 12.0–15.0)
MCH: 26.1 pg (ref 26.0–34.0)
MCHC: 32.6 g/dL (ref 30.0–36.0)
MCV: 80 fL (ref 78.0–100.0)
Platelets: 197 10*3/uL (ref 150–400)
RBC: 4.21 MIL/uL (ref 3.87–5.11)
RDW: 15.8 % — ABNORMAL HIGH (ref 11.5–15.5)
WBC: 7.9 10*3/uL (ref 4.0–10.5)

## 2018-01-17 LAB — TYPE AND SCREEN
ABO/RH(D): A POS
Antibody Screen: NEGATIVE

## 2018-01-17 LAB — ABO/RH: ABO/RH(D): A POS

## 2018-01-17 MED ORDER — LACTATED RINGERS IV SOLN
INTRAVENOUS | Status: DC
Start: 2018-01-17 — End: 2018-01-19
  Administered 2018-01-17 – 2018-01-19 (×4): via INTRAVENOUS

## 2018-01-17 MED ORDER — OXYTOCIN BOLUS FROM INFUSION: 500.0000 mL | Freq: Once | INTRAVENOUS | Status: DC

## 2018-01-17 MED ORDER — TERBUTALINE SULFATE 1 MG/ML IJ SOLN: 0.2500 mg | Freq: Once | INTRAMUSCULAR | Status: DC | PRN

## 2018-01-17 MED ORDER — LIDOCAINE HCL (PF) 1 % IJ SOLN
30.0000 mL | INTRAMUSCULAR | Status: DC | PRN
  Filled 2018-01-17: qty 30

## 2018-01-17 MED ORDER — OXYTOCIN 40 UNITS IN LACTATED RINGERS INFUSION - SIMPLE MED
1.0000 m[IU]/min | INTRAVENOUS | Status: DC
  Administered 2018-01-17: 2 m[IU]/min via INTRAVENOUS
  Administered 2018-01-18: 10 m[IU]/min via INTRAVENOUS

## 2018-01-17 MED ORDER — LACTATED RINGERS IV SOLN: 500.0000 mL | INTRAVENOUS | Status: DC | PRN

## 2018-01-17 MED ORDER — ONDANSETRON HCL 4 MG/2ML IJ SOLN: 4.0000 mg | Freq: Four times a day (QID) | INTRAMUSCULAR | Status: DC | PRN

## 2018-01-17 MED ORDER — SOD CITRATE-CITRIC ACID 500-334 MG/5ML PO SOLN
30.0000 mL | ORAL | Status: DC | PRN
  Administered 2018-01-19: 30 mL via ORAL
  Filled 2018-01-17: qty 15

## 2018-01-17 MED ORDER — ACETAMINOPHEN 325 MG PO TABS
650.0000 mg | ORAL_TABLET | ORAL | Status: DC | PRN
Start: 2018-01-17 — End: 2018-01-19

## 2018-01-17 MED ORDER — OXYCODONE-ACETAMINOPHEN 5-325 MG PO TABS: 1.0000 | ORAL_TABLET | ORAL | Status: DC | PRN

## 2018-01-17 MED ORDER — FENTANYL CITRATE (PF) 100 MCG/2ML IJ SOLN: 100.0000 ug | INTRAMUSCULAR | Status: DC | PRN

## 2018-01-17 MED ORDER — OXYTOCIN 40 UNITS IN LACTATED RINGERS INFUSION - SIMPLE MED
2.5000 [IU]/h | INTRAVENOUS | Status: DC
  Filled 2018-01-17: qty 1000

## 2018-01-17 MED ORDER — MISOPROSTOL 50MCG HALF TABLET
50.0000 ug | ORAL_TABLET | ORAL | Status: DC | PRN
  Administered 2018-01-17: 50 ug via BUCCAL
  Filled 2018-01-17: qty 1

## 2018-01-17 MED ORDER — OXYCODONE-ACETAMINOPHEN 5-325 MG PO TABS: 2.0000 | ORAL_TABLET | ORAL | Status: DC | PRN

## 2018-01-17 NOTE — MAU Provider Note (Signed)
History     CSN: [id number]  Arrival date and time: 01/17/18 4098    Chief Complaint  Patient presents with  . Contractions   HPI Patient is 27 y.o. [id number] [redacted]w[redacted]d here with complaints of contractions. PRegnancy is complicated by polyhydramnios (isolated). Pt scheduled for IOL today but delayed due to full LD. Reports ctx every 7-10 minutes.   +FM, denies LOF, VB, contractions, vaginal discharge.   OB History    Gravida  4   Para  1   Term  1   Preterm  0   AB  2   Living  1     SAB  2   TAB  0   Ectopic  0   Multiple  0   Live Births  1           Past Medical History:  Diagnosis Date  . Chlamydia 2010  . Infection    UTI  . Missed abortion 12/29/2016  . S/P dilatation and curettage 12/29/2016    Past Surgical History:  Procedure Laterality Date  . DILATION AND CURETTAGE OF UTERUS    . DILATION AND EVACUATION N/A 12/29/2016   Procedure: DILATATION AND EVACUATION;  Surgeon: Hermina Staggers, MD;  Location: [facility];  Service: Gynecology;  Laterality: N/A;  . WISDOM TOOTH EXTRACTION  2016    Family History  Problem Relation Age of Onset  . Heart disease Mother   . Heart disease Father   . Asthma Son   . Diabetes Maternal Aunt     Social History   Tobacco Use  . Smoking status: Never Smoker  . Smokeless tobacco: Never Used  Substance Use Topics  . Alcohol use: No  . Drug use: No    Allergies: No Known Allergies  Medications Prior to Admission  Medication Sig Dispense Refill Last Dose  . Elastic Bandages & Supports (COMFORT FIT MATERNITY SUPP MED) MISC One maternity belly support band (Patient not taking: Reported on 12/18/2017) 1 each 0 Not Taking  . ferrous sulfate (FERROUSUL) 325 (65 FE) MG tablet Take 1 tablet (325 mg total) by mouth 2 (two) times daily with a meal. 60 tablet 1 Taking  . Prenatal Vit-Fe Fumarate-FA (PRENATAL MULTIVITAMIN) TABS tablet Take 1 tablet by mouth daily at 12 noon.   Taking  . ranitidine (ZANTAC) 150 MG tablet  Take 1 tablet (150 mg total) by mouth at bedtime. (Patient not taking: Reported on 01/02/2018) 30 tablet 1 Not Taking    Review of Systems Physical Exam   Blood pressure 104/65, pulse 91, temperature 98.5 F (36.9 C), temperature source Oral, resp. rate 16, height 5' (1.524 m), weight 171 lb (77.6 kg), last menstrual period 04/12/2017, SpO2 99 %, unknown if currently breastfeeding.  Physical Exam  Nursing note and vitals reviewed. Constitutional: She is oriented to person, place, and time. She appears well-developed and well-nourished. No distress.  Pregnant female  HENT:  Head: Normocephalic and atraumatic.  Eyes: Conjunctivae are normal. No scleral icterus.  Neck: Normal range of motion. Neck supple.  Cardiovascular: Normal rate and intact distal pulses.  Respiratory: Effort normal. She exhibits no tenderness.  GI: Soft. There is no tenderness. There is no rebound and no guarding.  Gravid  Genitourinary: Vagina normal.  Musculoskeletal: Normal range of motion. She exhibits no edema.  Neurological: She is alert and oriented to person, place, and time.  Skin: Skin is warm and dry. No rash noted.  Psychiatric: She has a normal mood and affect.   Dilation:  2 Effacement (%): 50 Cervical Position: Posterior Station: -3 Presentation: Vertex Exam by:: Alvester MorinNewton, MD  Swept membranes effectively.  MAU Course  Procedures NST: 135/moderate/+accels (multiple) no decels. Reactive NST  MDM Reviewed imaging including US with mild poly Reviewed labs Discussed plan of care with IOL once LD has available room   Assessment and Plan  Early labor, offered sweeping of membranes- patient consented verbally and agree to procedure. Given return precautions in detail. Patient knows she will be called to return if LD bed is available or she should return if she is in labor.   Isa RankinKimberly Niles [facility] 01/17/2018, 10:43 AM

## 2018-01-17 NOTE — MAU Note (Signed)
Pt reports cramping and contractions off/on.

## 2018-01-17 NOTE — Progress Notes (Signed)
Labor and Delivery Progress Note  27yo [id number] at 6872w0d admitted for IOL for polyhydramnios.  Induction of Labor: Latent.  -- s/p cytotec x 1 -- FB placed at 1615 -- will consider pitocin if decreasing contraction frequency   FWB: Cephalic by BSUS.  -- GBS (-) -- continuous fetal monitoring: category I strip at this time

## 2018-01-17 NOTE — Anesthesia Pain Management Evaluation Note (Signed)
  CRNA Pain Management Visit Note  Patient: Christina Jennings, 27 y.o., female  "Hello I am a member of [facility] anesthesia team [facility]. We have an anesthesia team available at all times to provide care throughout the hospital, including epidural management and anesthesia for C-section. I don't know your plan for the delivery whether it a natural birth, water birth, IV sedation, nitrous supplementation, doula or epidural, but we want to meet your pain goals."   1.Was your pain managed to your expectations on prior hospitalizations?   Yes   2.What is your expectation for pain management during this hospitalization?     Epidural  3.How can we help you reach that goal? Possible epidural  Record the patient's initial score and the patient's pain goal.   Pain: 1  Pain Goal: 6 [facility] wants you to be able to say your pain was always managed very well.  Damond Borchers 01/17/2018

## 2018-01-17 NOTE — Progress Notes (Signed)
Patient ID: Christina Jennings, female   DOB: 1991-05-25, 27 y.o.   MRN: [id number]  Introduced self to patient.  Patient contractions are not painful and occurring every 5-10 minutes apart. otherwise no complaints. She would like an epidural at some point.  S/p foley bulb.  Cervical check was done and patient is 5.5cm dilated.  Pitocin was started. Membranes intact.    FHT: 145 bpm. Mod. Variability. Reactive. No decels.   BP 117/70   Pulse 82   Temp 98 F (36.7 C)   Resp 18   Ht 5' (1.524 m)   Wt 77.6 kg (171 lb)   LMP 04/12/2017 (Exact Date)   SpO2 99%   BMI 33.40 kg/m   Dilation: 5.5 Effacement (%): 60 Cervical Position: Posterior Station: -2 Presentation: Vertex Exam by:: L.Wallace

## 2018-01-17 NOTE — H&P (Signed)
LABOR AND DELIVERY ADMISSION HISTORY AND PHYSICAL NOTE  Christina Jennings is a 27 y.o. female [phone] with IUP at 6870w0d by LMP presenting for IOL for polyhydramnios.  She reports positive fetal movement. She denies leakage of fluid or vaginal bleeding.  Prenatal History/Complications: [facility] Pregnancy complications:  - Polyhydramnios  Past Medical History: Past Medical History:  Diagnosis Date  . Chlamydia 2010  . Infection    UTI  . Missed abortion 12/29/2016  . S/P dilatation and curettage 12/29/2016    Past Surgical History: Past Surgical History:  Procedure Laterality Date  . DILATION AND CURETTAGE OF UTERUS    . DILATION AND EVACUATION N/A 12/29/2016   Procedure: DILATATION AND EVACUATION;  Surgeon: Hermina StaggersErvin, Michael L, MD;  Location: [facility];  Service: Gynecology;  Laterality: N/A;  . WISDOM TOOTH EXTRACTION  2016    Obstetrical History: OB History    Gravida  4   Para  1   Term  1   Preterm  0   AB  2   Living  1     SAB  2   TAB  0   Ectopic  0   Multiple  0   Live Births  1           Social History: Social History   Socioeconomic History  . Marital status: Single    Spouse name: Not on file  . Number of children: Not on file  . Years of education: Not on file  . Highest education level: Not on file  Occupational History  . Not on file  Social Needs  . Financial resource strain: Not on file  . Food insecurity:    Worry: Not on file    Inability: Not on file  . Transportation needs:    Medical: Not on file    Non-medical: Not on file  Tobacco Use  . Smoking status: Never Smoker  . Smokeless tobacco: Never Used  Substance and Sexual Activity  . Alcohol use: No  . Drug use: No  . Sexual activity: Yes  Lifestyle  . Physical activity:    Days per week: Not on file    Minutes per session: Not on file  . Stress: Not on file  Relationships  . Social connections:    Talks on phone: Not on file    Gets together: Not  on file    Attends religious service: Not on file    Active member of club or organization: Not on file    Attends meetings of clubs or organizations: Not on file    Relationship status: Not on file  Other Topics Concern  . Not on file  Social History Narrative  . Not on file    Family History: Family History  Problem Relation Age of Onset  . Heart disease Mother   . Heart disease Father   . Asthma Son   . Diabetes Maternal Aunt     Allergies: No Known Allergies  Medications Prior to Admission  Medication Sig Dispense Refill Last Dose  . ferrous sulfate (FERROUSUL) 325 (65 FE) MG tablet Take 1 tablet (325 mg total) by mouth 2 (two) times daily with a meal. 60 tablet 1 Past Week at Unknown time  . Prenatal Vit-Fe Fumarate-FA (PRENATAL MULTIVITAMIN) TABS tablet Take 1 tablet by mouth daily at 12 noon.   Past Week at Unknown time  . Elastic Bandages & Supports (COMFORT FIT MATERNITY SUPP MED) MISC One maternity belly support  band (Patient not taking: Reported on 12/18/2017) 1 each 0 Not Taking  . ranitidine (ZANTAC) 150 MG tablet Take 1 tablet (150 mg total) by mouth at bedtime. (Patient not taking: Reported on 01/02/2018) 30 tablet 1 Not Taking at Unknown time     Review of Systems  All systems reviewed and negative except as stated in HPI  Physical Exam Blood pressure (!) 104/57, pulse 92, temperature 98 F (36.7 C), temperature source Oral, resp. rate 20, height 5' (1.524 m), weight 171 lb (77.6 kg), last menstrual period 04/12/2017, SpO2 99 %, unknown if currently breastfeeding. General appearance: alert, oriented, NAD Lungs: normal respiratory effort Heart: regular rate Abdomen: soft, non-tender; gravid, FH appropriate for GA Extremities: No calf swelling or tenderness Presentation: cephalic confirmed with bedside sono  Fetal monitoring: baseline 140 bpm, moderate variability, +acels, no decels  Uterine activity: q3-5 min Dilation: 2 Effacement (%): 50 Station: -3 Exam  by:: Earlene Plater  Prenatal labs: ABO, Rh: --/--/A POS (08/07 1125) Antibody: NEG (08/07 1125) Rubella: 5.39 (03/20 1218) RPR: Non Reactive (05/16 0953)  HBsAg: Negative (03/20 1218)  HIV: Non Reactive (05/16 0953)  GC/Chlamydia: Negative  GBS:   Negative  2-hr GTT: Normal  Genetic screening: Normal  Anatomy US: Normal   Prenatal Transfer Tool  Maternal Diabetes: No Genetic Screening: Normal Maternal Ultrasounds/Referrals: Normal Fetal Ultrasounds or other Referrals:  None Maternal Substance Abuse:  No Significant Maternal Medications:  None Significant Maternal Lab Results: None  Results for orders placed or performed during the hospital encounter of 01/17/18 (from the past 24 hour(s))  CBC   Collection Time: 01/17/18 11:25 AM  Result Value Ref Range   WBC 7.9 4.0 - 10.5 K/uL   RBC 4.21 3.87 - 5.11 MIL/uL   Hemoglobin 11.0 (L) 12.0 - 15.0 g/dL   HCT 16.1 (L) 09.6 - 04.5 %   MCV 80.0 78.0 - 100.0 fL   MCH 26.1 26.0 - 34.0 pg   MCHC 32.6 30.0 - 36.0 g/dL   RDW 40.9 (H) 81.1 - 91.4 %   Platelets 197 150 - 400 K/uL  Type and screen   Collection Time: 01/17/18 11:25 AM  Result Value Ref Range   ABO/RH(D) A POS    Antibody Screen NEG    Sample Expiration      01/20/2018 Performed at [facility], [address] [place], [place] [address]     Patient Active Problem List   Diagnosis Date Noted  . Polyhydramnios in third trimester 01/17/2018  . Polyhydramnios affecting pregnancy in third trimester 12/05/2017  . Late prenatal care affecting pregnancy 09/07/2017  . Anemia affecting pregnancy, antepartum 09/07/2017  . Supervision of other normal pregnancy, antepartum 08/30/2017    Assessment: Christina Jennings is a 27 y.o. G4P1021 at [redacted]w[redacted]d here for IOL for polyhydramnios.   #Labor: Early latent. Induction with cytotec.  #Pain: Epidural upon request  #FWB: Cat I  #ID:  GBS neg  #MOF: Breast  #MOC: Undecided  #Circ:  Outpatient   De Hollingshead 01/17/2018, 1:46  PM

## 2018-01-18 ENCOUNTER — Inpatient Hospital Stay (HOSPITAL_COMMUNITY): Payer: Medicaid Other | Admitting: Anesthesiology

## 2018-01-18 LAB — RPR: RPR: NONREACTIVE

## 2018-01-18 MED ORDER — LIDOCAINE HCL (PF) 1 % IJ SOLN
INTRAMUSCULAR | Status: DC | PRN
  Administered 2018-01-18: 8 mL via EPIDURAL

## 2018-01-18 MED ORDER — DIPHENHYDRAMINE HCL 50 MG/ML IJ SOLN: 12.5000 mg | INTRAMUSCULAR | Status: DC | PRN

## 2018-01-18 MED ORDER — LACTATED RINGERS IV SOLN: 500.0000 mL | Freq: Once | INTRAVENOUS | Status: DC

## 2018-01-18 MED ORDER — FENTANYL 2.5 MCG/ML BUPIVACAINE 1/10 % EPIDURAL INFUSION (WH - ANES)
INTRAMUSCULAR | Status: AC
  Filled 2018-01-18: qty 100

## 2018-01-18 MED ORDER — EPHEDRINE 5 MG/ML INJ: 10.0000 mg | INTRAVENOUS | Status: DC | PRN

## 2018-01-18 MED ORDER — PHENYLEPHRINE 40 MCG/ML (10ML) SYRINGE FOR IV PUSH (FOR BLOOD PRESSURE SUPPORT): 80.0000 ug | PREFILLED_SYRINGE | INTRAVENOUS | Status: DC | PRN

## 2018-01-18 MED ORDER — PHENYLEPHRINE 40 MCG/ML (10ML) SYRINGE FOR IV PUSH (FOR BLOOD PRESSURE SUPPORT)
80.0000 ug | PREFILLED_SYRINGE | INTRAVENOUS | Status: DC | PRN
  Administered 2018-01-18: 80 ug via INTRAVENOUS

## 2018-01-18 MED ORDER — PHENYLEPHRINE 40 MCG/ML (10ML) SYRINGE FOR IV PUSH (FOR BLOOD PRESSURE SUPPORT)
PREFILLED_SYRINGE | INTRAVENOUS | Status: AC
  Administered 2018-01-18: 80 ug via INTRAVENOUS
  Filled 2018-01-18: qty 10

## 2018-01-18 MED ORDER — FENTANYL 2.5 MCG/ML BUPIVACAINE 1/10 % EPIDURAL INFUSION (WH - ANES)
14.0000 mL/h | INTRAMUSCULAR | Status: DC | PRN
  Administered 2018-01-18 (×3): 14 mL/h via EPIDURAL
  Filled 2018-01-18 (×2): qty 100

## 2018-01-18 NOTE — Progress Notes (Signed)
Patient ID: Christina Jennings, female   DOB: 1990/09/21, 27 y.o.   MRN: [id number]  Micah FlesherWent to patient's room to perform cervical check.  Small change from 5.5 to 6.5.  Still -2.  Already on pitocin.  Don't want to rupture membranes until she has progressed further.  Patient has complaints.  She does not want epidural yet.    FHT: 135 bpm.  Moderate variability.  Present accels.  No decels.    BP 93/74   Pulse 85   Temp 97.9 F (36.6 C) (Oral)   Resp 18   Ht 5' (1.524 m)   Wt 77.6 kg   LMP 04/12/2017 (Exact Date)   SpO2 99%   BMI 33.40 kg/m   Dilation: 6.5 Effacement (%): 60 Cervical Position: Posterior Station: -2 Presentation: Vertex Exam by:: Dr. Constance Goltzlson

## 2018-01-18 NOTE — Progress Notes (Signed)
Corrie Dandyyana Gattuso is 27 y.o. [phone] female at 7130w1d presenting for IOL for polyhydramnios. Now has Epidural in place.   Pit at 7320mu/min with ctx q2-3 minutes. IUPC placed.  Dilation: 7 Effacement (%): 80 Cervical Position: Posterior Station: 0 Presentation: Vertex Exam by:: Lorn Junes. Goodman, RN   FHT: 135 bpm, moderate variability, +acels, no decels   Anticipate NSVD.   Marcy Sirenatherine Sanvika Cuttino, D.O. OB Fellow  01/18/2018, 10:59 AM

## 2018-01-18 NOTE — Progress Notes (Signed)
Patient ID: Christina Jennings, female   DOB: December 28, 1990, 27 y.o.   MRN: [id number]  Feeling some cramping; not very uncomfortable  BP 116/62, P 74 FHR 120s, +accels, no decels Ctx irreg q 2-4 mins with Pit at 6116mu/min Cx 6+/70/-2; AROM for mod clear/pink fluid  IUP@term  IOL process Polyhydramnios  Hopeful that AROM with Pit will increase labor Anticipate SVD  Divina Neale CNM 01/18/2018 7:01 AM

## 2018-01-18 NOTE — Progress Notes (Addendum)
Christina Jennings is a 27 y.o. [id number] at 5062w1d by LMP admitted for induction of labor due to polyhydramnios. Epidural in place.  Subjective: Cramping with contractions.  Objective: BP 101/67   Pulse (!) 107   Temp 98.8 F (37.1 C) (Oral)   Resp 20   Ht 5' (1.524 m)   Wt 77.6 kg   LMP 04/12/2017 (Exact Date)   SpO2 99%   BMI 33.40 kg/m  No intake/output data recorded. No intake/output data recorded.  FHT:  FHR: 140 bpm, variability: moderate,  accelerations:  Present,  decelerations:  Present 1-2 possible 10 bpm late decels UC:   regular, every 2-3 minutes SVE:   Dilation: 8.5 Effacement (%): 100 Station: 0 Exam by:: Yahoo! IncFoley,rn  Labs: Lab Results  Component Value Date   WBC 7.9 01/17/2018   HGB 11.0 (L) 01/17/2018   HCT 33.7 (L) 01/17/2018   MCV 80.0 01/17/2018   PLT 197 01/17/2018    Assessment / Plan: Induction of labor due to polyhydramnios,  progressing well on pitocin at 6120mu/min  Labor: progressing on pit Anticipated MOD:  SVD  Margarita RanaHarrison D Rose 01/18/2018, 9:40 PM

## 2018-01-18 NOTE — Anesthesia Preprocedure Evaluation (Signed)

## 2018-01-18 NOTE — Anesthesia Procedure Notes (Signed)
Epidural Patient location during procedure: OB Start time: 01/18/2018 7:55 AM End time: 01/18/2018 8:05 AM  Staffing Anesthesiologist: Bethena Midgetddono, Josslynn Mentzer, MD  Preanesthetic Checklist Completed: patient identified, site marked, surgical consent, pre-op evaluation, timeout performed, IV checked, risks and benefits discussed and monitors and equipment checked  Epidural Patient position: sitting Prep: site prepped and draped and DuraPrep Patient monitoring: continuous pulse ox and blood pressure Approach: midline Location: L3-L4 Injection technique: LOR air  Needle:  Needle type: Tuohy  Needle gauge: 17 G Needle length: 9 cm and 9 Needle insertion depth: 5 cm cm Catheter type: closed end flexible Catheter size: 19 Gauge Catheter at skin depth: 11 cm Test dose: negative  Assessment Events: blood not aspirated, injection not painful, no injection resistance, negative IV test and no paresthesia

## 2018-01-19 ENCOUNTER — Encounter (HOSPITAL_COMMUNITY)
Admission: AD | Disposition: A | Discharge status: Home / Self Care | Payer: Self-pay | Source: Home / Self Care | Attending: Obstetrics and Gynecology

## 2018-01-19 ENCOUNTER — Encounter (HOSPITAL_COMMUNITY): Payer: Self-pay | Admitting: *Deleted

## 2018-01-19 DIAGNOSIS — O411230 Chorioamnionitis, third trimester, not applicable or unspecified: Secondary | ICD-10-CM

## 2018-01-19 DIAGNOSIS — O403XX0 Polyhydramnios, third trimester, not applicable or unspecified: Secondary | ICD-10-CM

## 2018-01-19 DIAGNOSIS — O324XX0 Maternal care for high head at term, not applicable or unspecified: Secondary | ICD-10-CM

## 2018-01-19 DIAGNOSIS — Z3A40 40 weeks gestation of pregnancy: Secondary | ICD-10-CM

## 2018-01-19 LAB — CBC
HEMATOCRIT: 25.9 % — AB (ref 36.0–46.0)
Hemoglobin: 8.9 g/dL — ABNORMAL LOW (ref 12.0–15.0)
MCH: 27.1 pg (ref 26.0–34.0)
MCHC: 34.4 g/dL (ref 30.0–36.0)
MCV: 79 fL (ref 78.0–100.0)
Platelets: 133 10*3/uL — ABNORMAL LOW (ref 150–400)
RBC: 3.28 MIL/uL — ABNORMAL LOW (ref 3.87–5.11)
RDW: 15.9 % — AB (ref 11.5–15.5)
WBC: 12.9 10*3/uL — ABNORMAL HIGH (ref 4.0–10.5)

## 2018-01-19 LAB — CREATININE, SERUM
Creatinine, Ser: 1.38 mg/dL — ABNORMAL HIGH (ref 0.44–1.00)
GFR calc Af Amer: >60 mL/min (ref >60)
GFR calc non Af Amer: 52 mL/min — ABNORMAL LOW (ref >60)

## 2018-01-19 SURGERY — Surgical Case
Anesthesia: Epidural | Site: Abdomen | Wound class: Clean Contaminated

## 2018-01-19 MED ORDER — OXYCODONE HCL 5 MG PO TABS
5.0000 mg | ORAL_TABLET | ORAL | Status: DC | PRN
  Administered 2018-01-21: 5 mg via ORAL
  Filled 2018-01-19: qty 1

## 2018-01-19 MED ORDER — TETANUS-DIPHTH-ACELL PERTUSSIS 5-2.5-18.5 LF-MCG/0.5 IM SUSP: 0.5000 mL | Freq: Once | INTRAMUSCULAR | Status: DC

## 2018-01-19 MED ORDER — PROMETHAZINE HCL 25 MG/ML IJ SOLN
12.5000 mg | Freq: Once | INTRAMUSCULAR | Status: AC
  Administered 2018-01-19: 12.5 mg via INTRAVENOUS
  Filled 2018-01-19: qty 1

## 2018-01-19 MED ORDER — IBUPROFEN 600 MG PO TABS
600.0000 mg | ORAL_TABLET | Freq: Four times a day (QID) | ORAL | Status: DC
  Administered 2018-01-19 – 2018-01-22 (×11): 600 mg via ORAL
  Filled 2018-01-19 (×11): qty 1

## 2018-01-19 MED ORDER — ACETAMINOPHEN 325 MG PO TABS
650.0000 mg | ORAL_TABLET | ORAL | Status: DC | PRN
  Administered 2018-01-21: 650 mg via ORAL
  Filled 2018-01-19: qty 2

## 2018-01-19 MED ORDER — FENTANYL CITRATE (PF) 100 MCG/2ML IJ SOLN
INTRAMUSCULAR | Status: AC
  Filled 2018-01-19: qty 2

## 2018-01-19 MED ORDER — ENOXAPARIN SODIUM 40 MG/0.4ML ~~LOC~~ SOLN
40.0000 mg | SUBCUTANEOUS | Status: DC
  Filled 2018-01-19 (×3): qty 0.4

## 2018-01-19 MED ORDER — MEPERIDINE HCL 25 MG/ML IJ SOLN: 6.2500 mg | INTRAMUSCULAR | Status: DC | PRN

## 2018-01-19 MED ORDER — BUPIVACAINE HCL (PF) 0.25 % IJ SOLN
INTRAMUSCULAR | Status: AC
Start: 2018-01-19 — End: ?
  Filled 2018-01-19: qty 30

## 2018-01-19 MED ORDER — PROMETHAZINE HCL 25 MG/ML IJ SOLN: 6.2500 mg | INTRAMUSCULAR | Status: DC | PRN

## 2018-01-19 MED ORDER — FENTANYL CITRATE (PF) 100 MCG/2ML IJ SOLN
INTRAMUSCULAR | Status: DC | PRN
  Administered 2018-01-19 (×2): 50 ug via INTRAVENOUS

## 2018-01-19 MED ORDER — PRENATAL MULTIVITAMIN CH
1.0000 | ORAL_TABLET | Freq: Every day | ORAL | Status: DC
  Administered 2018-01-20 – 2018-01-22 (×3): 1 via ORAL
  Filled 2018-01-19 (×3): qty 1

## 2018-01-19 MED ORDER — KETOROLAC TROMETHAMINE 30 MG/ML IJ SOLN
INTRAMUSCULAR | Status: AC
  Filled 2018-01-19: qty 1

## 2018-01-19 MED ORDER — NALBUPHINE HCL 10 MG/ML IJ SOLN: 5.0000 mg | INTRAMUSCULAR | Status: DC | PRN

## 2018-01-19 MED ORDER — SENNOSIDES-DOCUSATE SODIUM 8.6-50 MG PO TABS
2.0000 | ORAL_TABLET | ORAL | Status: DC
  Administered 2018-01-20 – 2018-01-21 (×3): 2 via ORAL
  Filled 2018-01-19 (×3): qty 2

## 2018-01-19 MED ORDER — NALBUPHINE HCL 10 MG/ML IJ SOLN: 5.0000 mg | Freq: Once | INTRAMUSCULAR | Status: DC | PRN

## 2018-01-19 MED ORDER — MENTHOL 3 MG MT LOZG: 1.0000 | LOZENGE | OROMUCOSAL | Status: DC | PRN

## 2018-01-19 MED ORDER — LACTATED RINGERS IV SOLN
INTRAVENOUS | Status: DC
  Administered 2018-01-19: 17:00:00 via INTRAVENOUS

## 2018-01-19 MED ORDER — ONDANSETRON HCL 4 MG/2ML IJ SOLN
INTRAMUSCULAR | Status: DC | PRN
  Administered 2018-01-19: 4 mg via INTRAVENOUS

## 2018-01-19 MED ORDER — ZOLPIDEM TARTRATE 5 MG PO TABS: 5.0000 mg | ORAL_TABLET | Freq: Every evening | ORAL | Status: DC | PRN

## 2018-01-19 MED ORDER — SIMETHICONE 80 MG PO CHEW: 80.0000 mg | CHEWABLE_TABLET | ORAL | Status: DC | PRN

## 2018-01-19 MED ORDER — MORPHINE SULFATE (PF) 0.5 MG/ML IJ SOLN
INTRAMUSCULAR | Status: AC
  Filled 2018-01-19: qty 10

## 2018-01-19 MED ORDER — WITCH HAZEL-GLYCERIN EX PADS: 1.0000 "application " | MEDICATED_PAD | CUTANEOUS | Status: DC | PRN

## 2018-01-19 MED ORDER — SIMETHICONE 80 MG PO CHEW
80.0000 mg | CHEWABLE_TABLET | Freq: Three times a day (TID) | ORAL | Status: DC
  Administered 2018-01-19 – 2018-01-22 (×6): 80 mg via ORAL
  Filled 2018-01-19 (×7): qty 1

## 2018-01-19 MED ORDER — GENTAMICIN SULFATE 40 MG/ML IJ SOLN
5.0000 mg/kg | Freq: Once | INTRAVENOUS | Status: AC
  Administered 2018-01-19: 100 mg via INTRAVENOUS
  Filled 2018-01-19: qty 9.75

## 2018-01-19 MED ORDER — HYDROMORPHONE HCL 1 MG/ML IJ SOLN: 0.2500 mg | INTRAMUSCULAR | Status: DC | PRN

## 2018-01-19 MED ORDER — SCOPOLAMINE 1 MG/3DAYS TD PT72
MEDICATED_PATCH | TRANSDERMAL | Status: AC
  Filled 2018-01-19: qty 1

## 2018-01-19 MED ORDER — SCOPOLAMINE 1 MG/3DAYS TD PT72
MEDICATED_PATCH | TRANSDERMAL | Status: DC | PRN
  Administered 2018-01-19: 1 via TRANSDERMAL

## 2018-01-19 MED ORDER — ALBUMIN HUMAN 5 % IV SOLN
12.5000 g | Freq: Once | INTRAVENOUS | Status: AC
  Administered 2018-01-19: 12.5 g via INTRAVENOUS

## 2018-01-19 MED ORDER — OXYCODONE HCL 5 MG PO TABS: 10.0000 mg | ORAL_TABLET | ORAL | Status: DC | PRN

## 2018-01-19 MED ORDER — LACTATED RINGERS IV SOLN
INTRAVENOUS | Status: DC | PRN
  Administered 2018-01-19 (×2): via INTRAVENOUS

## 2018-01-19 MED ORDER — KETOROLAC TROMETHAMINE 30 MG/ML IJ SOLN: 30.0000 mg | Freq: Four times a day (QID) | INTRAMUSCULAR | Status: AC | PRN

## 2018-01-19 MED ORDER — PHENYLEPHRINE 40 MCG/ML (10ML) SYRINGE FOR IV PUSH (FOR BLOOD PRESSURE SUPPORT)
PREFILLED_SYRINGE | INTRAVENOUS | Status: DC | PRN
  Administered 2018-01-19 (×8): 80 ug via INTRAVENOUS

## 2018-01-19 MED ORDER — OXYCODONE HCL 5 MG/5ML PO SOLN: 5.0000 mg | Freq: Once | ORAL | Status: DC | PRN

## 2018-01-19 MED ORDER — ACETAMINOPHEN 500 MG PO TABS
1000.0000 mg | ORAL_TABLET | Freq: Once | ORAL | Status: AC
  Administered 2018-01-19: 1000 mg via ORAL
  Filled 2018-01-19: qty 2

## 2018-01-19 MED ORDER — ONDANSETRON HCL 4 MG/2ML IJ SOLN
INTRAMUSCULAR | Status: AC
  Filled 2018-01-19: qty 2

## 2018-01-19 MED ORDER — SIMETHICONE 80 MG PO CHEW
80.0000 mg | CHEWABLE_TABLET | ORAL | Status: DC
  Administered 2018-01-20 – 2018-01-21 (×3): 80 mg via ORAL
  Filled 2018-01-19 (×3): qty 1

## 2018-01-19 MED ORDER — LIDOCAINE-EPINEPHRINE (PF) 2 %-1:200000 IJ SOLN
INTRAMUSCULAR | Status: AC
  Filled 2018-01-19: qty 20

## 2018-01-19 MED ORDER — KETOROLAC TROMETHAMINE 30 MG/ML IJ SOLN
30.0000 mg | Freq: Four times a day (QID) | INTRAMUSCULAR | Status: AC | PRN
  Administered 2018-01-19: 30 mg via INTRAMUSCULAR

## 2018-01-19 MED ORDER — GENTAMICIN SULFATE 40 MG/ML IJ SOLN
5.0000 mg/kg | Freq: Once | INTRAVENOUS | Status: AC
  Administered 2018-01-20: 390 mg via INTRAVENOUS
  Filled 2018-01-19: qty 9.75

## 2018-01-19 MED ORDER — SODIUM BICARBONATE 8.4 % IV SOLN
INTRAVENOUS | Status: DC | PRN
  Administered 2018-01-19: 10 mL via EPIDURAL

## 2018-01-19 MED ORDER — NALOXONE HCL 4 MG/10ML IJ SOLN
1.0000 ug/kg/h | INTRAVENOUS | Status: DC | PRN
  Filled 2018-01-19: qty 5

## 2018-01-19 MED ORDER — OXYCODONE HCL 5 MG PO TABS: 5.0000 mg | ORAL_TABLET | Freq: Once | ORAL | Status: DC | PRN

## 2018-01-19 MED ORDER — PHENYLEPHRINE 40 MCG/ML (10ML) SYRINGE FOR IV PUSH (FOR BLOOD PRESSURE SUPPORT)
PREFILLED_SYRINGE | INTRAVENOUS | Status: AC
  Filled 2018-01-19: qty 10

## 2018-01-19 MED ORDER — DIPHENHYDRAMINE HCL 25 MG PO CAPS
25.0000 mg | ORAL_CAPSULE | ORAL | Status: DC | PRN
  Filled 2018-01-19: qty 1

## 2018-01-19 MED ORDER — SODIUM CHLORIDE 0.9 % IR SOLN
Status: DC | PRN
  Administered 2018-01-19: 1

## 2018-01-19 MED ORDER — SCOPOLAMINE 1 MG/3DAYS TD PT72
1.0000 | MEDICATED_PATCH | Freq: Once | TRANSDERMAL | Status: AC
  Administered 2018-01-19: 1.5 mg via TRANSDERMAL

## 2018-01-19 MED ORDER — OXYTOCIN 10 UNIT/ML IJ SOLN
INTRAMUSCULAR | Status: DC | PRN
  Administered 2018-01-19: 40 [IU] via INTRAVENOUS

## 2018-01-19 MED ORDER — CLINDAMYCIN PHOSPHATE 900 MG/50ML IV SOLN
900.0000 mg | Freq: Three times a day (TID) | INTRAVENOUS | Status: AC
  Administered 2018-01-19 (×2): 900 mg via INTRAVENOUS
  Filled 2018-01-19 (×2): qty 50

## 2018-01-19 MED ORDER — ONDANSETRON HCL 4 MG/2ML IJ SOLN
4.0000 mg | Freq: Three times a day (TID) | INTRAMUSCULAR | Status: DC | PRN
  Administered 2018-01-19: 4 mg via INTRAVENOUS
  Filled 2018-01-19: qty 2

## 2018-01-19 MED ORDER — MORPHINE SULFATE (PF) 0.5 MG/ML IJ SOLN
INTRAMUSCULAR | Status: DC | PRN
  Administered 2018-01-19: 3 mg via EPIDURAL

## 2018-01-19 MED ORDER — OXYTOCIN 40 UNITS IN LACTATED RINGERS INFUSION - SIMPLE MED
2.5000 [IU]/h | INTRAVENOUS | Status: AC
  Administered 2018-01-19: 2.5 [IU]/h via INTRAVENOUS

## 2018-01-19 MED ORDER — DIBUCAINE 1 % RE OINT: 1.0000 "application " | TOPICAL_OINTMENT | RECTAL | Status: DC | PRN

## 2018-01-19 MED ORDER — DIPHENHYDRAMINE HCL 50 MG/ML IJ SOLN: 12.5000 mg | INTRAMUSCULAR | Status: DC | PRN

## 2018-01-19 MED ORDER — SODIUM BICARBONATE 8.4 % IV SOLN
INTRAVENOUS | Status: DC | PRN
  Administered 2018-01-19: 10 mL via EPIDURAL
  Administered 2018-01-19: 3 mL via EPIDURAL

## 2018-01-19 MED ORDER — CEFAZOLIN SODIUM-DEXTROSE 2-4 GM/100ML-% IV SOLN: 2.0000 g | Freq: Once | INTRAVENOUS | Status: DC

## 2018-01-19 MED ORDER — CLINDAMYCIN PHOSPHATE 900 MG/50ML IV SOLN: 900.0000 mg | Freq: Once | INTRAVENOUS | Status: DC

## 2018-01-19 MED ORDER — KETOROLAC TROMETHAMINE 30 MG/ML IJ SOLN: 30.0000 mg | Freq: Once | INTRAMUSCULAR | Status: DC | PRN

## 2018-01-19 MED ORDER — OXYTOCIN 10 UNIT/ML IJ SOLN
INTRAMUSCULAR | Status: AC
  Filled 2018-01-19: qty 4

## 2018-01-19 MED ORDER — BUPIVACAINE HCL (PF) 0.5 % IJ SOLN
INTRAMUSCULAR | Status: DC | PRN
  Administered 2018-01-19: 25 mL

## 2018-01-19 MED ORDER — SODIUM CHLORIDE 0.9% FLUSH: 3.0000 mL | INTRAVENOUS | Status: DC | PRN

## 2018-01-19 MED ORDER — LACTATED RINGERS IV SOLN
INTRAVENOUS | Status: DC | PRN
  Administered 2018-01-19: 07:00:00 via INTRAVENOUS

## 2018-01-19 MED ORDER — DIPHENHYDRAMINE HCL 25 MG PO CAPS: 25.0000 mg | ORAL_CAPSULE | Freq: Four times a day (QID) | ORAL | Status: DC | PRN

## 2018-01-19 MED ORDER — COCONUT OIL OIL: 1.0000 "application " | TOPICAL_OIL | Status: DC | PRN

## 2018-01-19 MED ORDER — NALOXONE HCL 0.4 MG/ML IJ SOLN: 0.4000 mg | INTRAMUSCULAR | Status: DC | PRN

## 2018-01-19 SURGICAL SUPPLY — 37 items
BENZOIN TINCTURE PRP APPL 2/3 (GAUZE/BANDAGES/DRESSINGS) ×3 IMPLANT
CHLORAPREP W/TINT 26ML (MISCELLANEOUS) ×3 IMPLANT
CLAMP CORD UMBIL (MISCELLANEOUS) IMPLANT
CLOSURE STERI STRIP 1/2 X4 (GAUZE/BANDAGES/DRESSINGS) ×3 IMPLANT
CLOSURE WOUND 1/2 X4 (GAUZE/BANDAGES/DRESSINGS)
CLOTH BEACON ORANGE TIMEOUT ST (SAFETY) ×3 IMPLANT
DRSG OPSITE POSTOP 4X10 (GAUZE/BANDAGES/DRESSINGS) ×3 IMPLANT
ELECT REM PT RETURN 9FT ADLT (ELECTROSURGICAL) ×3
ELECTRODE REM PT RTRN 9FT ADLT (ELECTROSURGICAL) ×1 IMPLANT
EXTRACTOR VACUUM BELL STYLE (SUCTIONS) IMPLANT
GAUZE SPONGE 4X4 12PLY STRL LF (GAUZE/BANDAGES/DRESSINGS) ×6 IMPLANT
GLOVE BIOGEL PI IND STRL 6.5 (GLOVE) ×1 IMPLANT
GLOVE BIOGEL PI IND STRL 7.0 (GLOVE) ×2 IMPLANT
GLOVE BIOGEL PI INDICATOR 6.5 (GLOVE) ×2
GLOVE BIOGEL PI INDICATOR 7.0 (GLOVE) ×4
GLOVE ORTHOPEDIC STR SZ6.5 (GLOVE) ×3 IMPLANT
GOWN STRL REUS W/TWL LRG LVL3 (GOWN DISPOSABLE) ×9 IMPLANT
HEMOSTAT ARISTA ABSORB 3G PWDR (MISCELLANEOUS) ×3 IMPLANT
KIT ABG SYR 3ML LUER SLIP (SYRINGE) IMPLANT
NEEDLE HYPO 22GX1.5 SAFETY (NEEDLE) ×6 IMPLANT
NEEDLE HYPO 25X1 1.5 SAFETY (NEEDLE) IMPLANT
NS IRRIG 1000ML POUR BTL (IV SOLUTION) ×3 IMPLANT
PACK C SECTION WH (CUSTOM PROCEDURE TRAY) ×3 IMPLANT
PAD ABD 7.5X8 STRL (GAUZE/BANDAGES/DRESSINGS) ×3 IMPLANT
PAD OB MATERNITY 4.3X12.25 (PERSONAL CARE ITEMS) ×3 IMPLANT
PENCIL SMOKE EVAC W/HOLSTER (ELECTROSURGICAL) ×3 IMPLANT
SPONGE LAP 18X18 X RAY DECT (DISPOSABLE) ×9 IMPLANT
STRIP CLOSURE SKIN 1/2X4 (GAUZE/BANDAGES/DRESSINGS) IMPLANT
SUT MON AB 4-0 PS1 27 (SUTURE) ×3 IMPLANT
SUT PLAIN 2 0 (SUTURE) ×2
SUT PLAIN ABS 2-0 CT1 27XMFL (SUTURE) ×1 IMPLANT
SUT VIC AB 0 CT1 36 (SUTURE) ×6 IMPLANT
SUT VIC AB 0 CTX 36 (SUTURE) ×2
SUT VIC AB 0 CTX36XBRD ANBCTRL (SUTURE) ×1 IMPLANT
SYR CONTROL 10ML LL (SYRINGE) ×6 IMPLANT
TOWEL OR 17X24 6PK STRL BLUE (TOWEL DISPOSABLE) ×3 IMPLANT
TRAY FOLEY W/BAG SLVR 14FR LF (SET/KITS/TRAYS/PACK) ×3 IMPLANT

## 2018-01-19 NOTE — Lactation Note (Signed)
This note was copied from a baby's chart. Lactation Consultation Note Baby 13 hrs old. Mom stating she is wanting to formula feed as well as BF.  Mom has been sick having emesis after c/section. Not feeling well, in a agitated mood. When LC entered rm, mom looked at me then made little contact. Had pursed lip w/mad look on her face. Not talking hardly at all. RN in rm. Stating baby has increased resp. And HR. Encouraged mom to put baby STS. FOB spoke up saying baby has been STS all day while she has been BF. LC stated that STS helps lower resp. And HR. FOB stated mom isn't feeling well. LC explained to FOB that he can do STS as well. He denied opportunity.  RN reviewed lead w/mom. LC gave mom supplementing guidelines of amounts to feed.  Mom encouraged to feed baby 8-12 times/24 hours and with feeding cues.  Newborn feeding habits briefed d/t mom's mood. Asked mom if she knew how to hand express, mom shook her head no. LC asked permission to demonstrated hand expression and touch breast, mom stated yes. Mom has round breast w/short shaft compressible nipples. Little glisten to nipple visualized. Mom didn't BF her 1st child. Mom has WIC. Rush BarerGerber formula given by RN. Encouraged to call for assistance or questions.  [facility] brochure given w/resources, support groups and LC services.  Patient Name: Christina Jennings [id number]'s Date: 01/19/2018 Reason for consult: Initial assessment   Maternal Data    Feeding    LATCH Score       Type of Nipple: Everted at rest and after stimulation(short shaft)  Comfort (Breast/Nipple): Soft / non-tender        Interventions Interventions: Breast feeding basics reviewed;Support pillows;Hand express;Breast massage;Breast compression  Lactation Tools Discussed/Used WIC Program: Yes   Consult Status Consult Status: Follow-up Date: 01/20/18 Follow-up type: In-patient    Christina DancerCARVER, Brook Geraci G 01/19/2018, 8:41 PM

## 2018-01-19 NOTE — Progress Notes (Signed)
Faculty Note  In to see patient, she has made little progress with pushing off and on since 1 am. Caput noted on fetal head, minimal descent. Has had cat II strip, with intermittent late decels, moderate variability and accels present, now with fetal tachycardia. She is now chorioamnionitis. CNM had reviewed course with patient and she opted to proceed with primary c-section. In to see patient and she again verbalizes her consent to proceed with primary c-section. The risks of cesarean section were discussed with the patient; including but not limited to: infection which may require antibiotics; bleeding which may require transfusion or re-operation; injury to bowel, bladder, ureters or other surrounding organs; placental abnormalities wth subsequent pregnancies, risk of needing c-sections in future pregnancies, incisional problems, thromboembolic phenomenon and other postoperative/anesthesia complications. Answered all questions. The patient verbalized understanding of the plan, giving informed consent for the procedure. She is agreeable to blood transfusion in the event of emergency.  Ancef 2 gms NPO Anesthesia and OR aware SCDs ordered on call to the OR.   To OR when ready.

## 2018-01-19 NOTE — Progress Notes (Signed)
Pharmacy Antibiotic Note  Christina Jennings is a 27 y.o. female admitted on 01/17/2018 for IOL and is now s/p Csection.  Pharmacy has been consulted for gentamicin dosing.  Plan: Gentamicin 5 mg/kg x 1 additional dose on 8/10 @ 06:00 mixed with last clindamycin dose.  Height: 5' (152.4 cm) Weight: 171 lb (77.6 kg) IBW/kg (Calculated) : 45.5 kg  Temp (24hrs), Avg:99 F (37.2 C), Min:97.7 F (36.5 C), Max:101.2 F (38.4 C)  Recent Labs  Lab 01/17/18 1125  WBC 7.9    CrCl cannot be calculated (Patient's most recent lab result is older than the maximum 21 days allowed.).    No Known Allergies  Antimicrobials this admission: Gentamicin 8/9 >> 8/10 Clindamycin 8/9 >> 8/10  Thank you for allowing pharmacy to be a part of this patient's care.  Natasha BenceCline, Dayvian Blixt 01/19/2018 10:17 AM

## 2018-01-19 NOTE — Progress Notes (Addendum)
Labor Progress Note Christina Jennings is a 27 y.o. [id number] at 2947w2d presented for IOL for polyhydramnios. S:  Feeling well, no complaints. No urge to push. CTX every 5-10 mins.  O:  BP 115/71   Pulse 70   Temp 99.6 F (37.6 C) (Oral)   Resp 16   Ht 5' (1.524 m)   Wt 77.6 kg   LMP 04/12/2017 (Exact Date)   SpO2 99%   BMI 33.40 kg/m    EFM: 150 bpm/ mod variability/ accels, some late decels CVE: Dilation: 10 Dilation Complete Date: 01/18/18 Dilation Complete Time: 2301 Effacement (%): 100 Cervical Position: Posterior Station: 0, Plus 1 Presentation: Vertex Exam by:: Foley,rn   A&P:   27 y.o. [id number] 9647w2d for IOL for polyhydramnios. #Labor: Continue pit and labor down. Will recheck in 2 hrs #FWB: Cat 2 #GBS negative   Christina Jennings, Student-PA 1:13 AM

## 2018-01-19 NOTE — Progress Notes (Signed)
Pharmacy Antibiotic Note  Christina Jennings is a 27 y.o. [id number] AT [redacted]W[redacted]D admitted on 01/17/2018 for IOL for polyhydramnios. Pt is proceeding with cesarean section due to concerns for chorioamnionitis. Pharmacy has been consulted for gentamicin dosing.    Plan: Gentamicin 5 mg/kg IV x 1 dose which is also a standard surgical prophylaxis dose. If gentamicin continued >24 hrs gentamicin can be re dosed at that time. Serum creatinine every 72 hours while on gentamicin. Gentamicin levels as indicated  Height: 5' (152.4 cm) Weight: 171 lb (77.6 kg) IBW/kg (Calculated) : 45.5  Temp (24hrs), Avg:99 F (37.2 C), Min:97.7 F (36.5 C), Max:101.2 F (38.4 C)  Recent Labs  Lab 01/17/18 1125  WBC 7.9    CrCl cannot be calculated (Patient's most recent lab result is older than the maximum 21 days allowed.).    No Known Allergies  Antimicrobials this admission:  Dose adjustments this admission: N/A  Microbiology results:  Thank you for allowing pharmacy to be a part of this patient's care.  Christina Jennings, Christina Jennings 01/19/2018 6:31 AM

## 2018-01-19 NOTE — Addendum Note (Signed)
Addendum  created 01/19/18 1433 by Angela AdamWrinkle, Montrel Donahoe G, CRNA   Sign clinical note

## 2018-01-19 NOTE — Transfer of Care (Signed)
Immediate Anesthesia Transfer of Care Note  Patient: Christina Jennings  Procedure(s) Performed: CESAREAN SECTION (N/A Abdomen)  Patient Location: PACU  Anesthesia Type:Epidural  Level of Consciousness: awake  Airway & Oxygen Therapy: Patient Spontanous Breathing  Post-op Assessment: Report given to RN and Post -op Vital signs reviewed and stable  Post vital signs: Reviewed  Last Vitals:  Vitals Value Taken Time  BP    Temp    Pulse    Resp    SpO2      Last Pain:  Vitals:   01/19/18 0600  TempSrc: Axillary  PainSc:          Complications: No apparent anesthesia complications

## 2018-01-19 NOTE — Op Note (Addendum)
[name] PROCEDURE DATE: 01/19/2018  PREOPERATIVE DIAGNOSES: Intrauterine pregnancy at [redacted]w[redacted]d weeks gestation; failure to progress: arrest of descent, chorioamnionitis  POSTOPERATIVE DIAGNOSES: The same  PROCEDURE: Primary Low Transverse Cesarean Section  SURGEON:  Baldemar Lenis, MD  ASSISTANT:  none  ANESTHESIOLOGY TEAM: Anesthesiologist: Shelton Silvas, MD; Lowella Curb, MD; Bethena Midget, MD CRNA: Renford Dills, CRNA; Angela Adam, CRNA  INDICATIONS: Christina Jennings is a 27 y.o. [phone] at [redacted]w[redacted]d here for cesarean section secondary to the indications listed under preoperative diagnoses; please see preoperative note for further details.  The risks of cesarean section were discussed with the patient including but were not limited to: bleeding which may require transfusion or reoperation; infection which may require antibiotics; injury to bowel, bladder, ureters or other surrounding organs; injury to the fetus; need for additional procedures including hysterectomy in the event of a life-threatening hemorrhage; placental abnormalities wth subsequent pregnancies, incisional problems, thromboembolic phenomenon and other postoperative/anesthesia complications.  She consents to blood transfusion in the event of an emergency. The patient verbalized understanding of the plan, giving informed written consent for the procedure.    FINDINGS:  Viable female infant in cephalic presentation.  Apgars pending.  Clear amniotic fluid.  Intact placenta, three vessel cord.  Normal appearing very edematous uterus and bladder, normal fallopian tubes and ovaries bilaterally.   ANESTHESIA: Spinal  INTRAVENOUS FLUIDS: 2250 ml   ESTIMATED BLOOD LOSS: 1535 ml URINE OUTPUT:  150 ml SPECIMENS: Placenta sent to pathology COMPLICATIONS: None immediate  PROCEDURE IN DETAIL:  The patient preoperatively received intravenous antibiotics and had sequential compression devices applied to her lower extremities.  She  was then taken to the operating room where the epidural anesthesia was dosed up to surgical level and was found to be adequate. She was then placed in a dorsal supine position with a leftward tilt, and prepped and draped in a sterile manner.  A foley catheter was placed into her bladder with sterile technique and attached to constant gravity.  After a timeout was performed, a Pfannenstiel skin incision was made with scalpel and carried through to the underlying layer of fascia. The fascia was incised in the midline, and this incision was extended bilaterally using the Mayo scissors.  Kocher clamps were applied to the superior aspect of the fascial incision and the underlying rectus muscles were dissected off bluntly.  A similar process was carried out on the inferior aspect of the fascial incision. The rectus muscles were separated in the midline bluntly and the peritoneum was entered bluntly. The peritoneal incision was carefully extended bluntly laterally and caudad with good visualization of the bladder. The uterus appeared normal with a significant amount of edema in lower uterus and bladder. An Alexis retractor was placed into the abdomen. The bladder blade was inserted. Attention was turned to the lower uterine segment where a low transverse hysterotomy was made with a scalpel and extended bilaterally bluntly.  The infant was delivered from LOP position, nose and mouth were bulb suctioned, and the cord clamped and cut after 1 minute. The infant was then handed over to the waiting neonatology team. Very brisk bleeding with large uterine vessels clamped after delivery of infant. Uterine massage was then performed, and the placenta delivered intact with a three-vessel cord. The hysterotomy was closed with 0 Vicryl in a running locked fashion, and an imbricating layer was also placed with 0 Vicryl. Figure-of-eight 0 Vicryl serosal stitches were placed to help with hemostasis.  The fallopian tubes and  ovaries were  visualized bilaterally and normal appearing.    The pelvis was cleared of all clot and debris. Arista was placed over the hysterotomy for additional hemostasis.  Hemostasis was again confirmed on all surfaces. The peritoneum was re-approximated using 2-0 Vicryl suture. The fascia was then closed using 0 Vicryl in a running fashion.  The subcutaneous layer was irrigated, then reapproximated with 2-0 plain gut, and 25 ml of 0.5% Marcaine was injected subcutaneously around the incision.  The skin was closed with a 4-0 Monocryl subcuticular stitch. The patient tolerated the procedure well. Sponge, lap, instrument and needle counts were correct x 3.  She was taken to the recovery room in stable condition.    Baldemar LenisK. Meryl Alante Weimann, M.D. Attending Obstetrician & Gynecologist, [facility], [facility]

## 2018-01-19 NOTE — Anesthesia Postprocedure Evaluation (Signed)
Anesthesia Post Note  Patient: Christina Jennings  Procedure(s) Performed: CESAREAN SECTION (N/A Abdomen)     Patient location during evaluation: PACU Anesthesia Type: Epidural Level of consciousness: awake and alert Pain management: pain level controlled Vital Signs Assessment: post-procedure vital signs reviewed and stable Respiratory status: spontaneous breathing, nonlabored ventilation and respiratory function stable Cardiovascular status: stable Postop Assessment: no headache, no backache and epidural receding Anesthetic complications: no    Last Vitals:  Vitals:   01/19/18 0811 01/19/18 0815  BP:  105/66  Pulse: 70 94  Resp: 16 19  Temp:    SpO2: 100% 100%    Last Pain:  Vitals:   01/19/18 0600  TempSrc: Axillary  PainSc:    Pain Goal:                 Shelton SilvasKevin D Jarrell Armond

## 2018-01-19 NOTE — Anesthesia Postprocedure Evaluation (Signed)
Anesthesia Post Note  Patient: Christina Jennings  Procedure(s) Performed: CESAREAN SECTION (N/A Abdomen)     Patient location during evaluation: Mother Baby Anesthesia Type: Epidural Level of consciousness: awake and alert, oriented and patient cooperative Pain management: pain level controlled Vital Signs Assessment: post-procedure vital signs reviewed and stable Respiratory status: spontaneous breathing Cardiovascular status: stable Postop Assessment: no headache, epidural receding and patient able to bend at knees Anesthetic complications: no Comments: Pain score 5.  Pt states pain is manageable.     Last Vitals:  Vitals:   01/19/18 1223 01/19/18 1348  BP: 98/76 101/65  Pulse: 68 (!) 50  Resp: 18 18  Temp: 36.8 C 36.6 C  SpO2: 99% 100%    Last Pain:  Vitals:   01/19/18 1421  TempSrc:   PainSc: 5    Pain Goal:                 [facility] Eye SurgeonsWRINKLE,Christina Jennings

## 2018-01-19 NOTE — Progress Notes (Signed)
Vitals:   01/19/18 0301 01/19/18 0400  BP: (!) 88/45 108/68  Pulse: 62 76  Resp: 18   Temp:    SpO2:     Pushing actively for about an hour. Ctx finally getting closer together, q 3-5 minutes.  FHR w/moderate variability, some variable decels which have responded to 02, position changes, occ late decel, + accels.  Some descent of vtx, but caput forming as well.  Pt motivated to continue pushing.

## 2018-01-20 ENCOUNTER — Encounter (HOSPITAL_COMMUNITY): Payer: Self-pay | Admitting: Obstetrics and Gynecology

## 2018-01-20 LAB — COMPREHENSIVE METABOLIC PANEL
ALBUMIN: 2.2 g/dL — AB (ref 3.5–5.0)
ALT: 11 U/L (ref 0–44)
AST: 28 U/L (ref 15–41)
Alkaline Phosphatase: 83 U/L (ref 38–126)
Anion gap: 9 (ref 5–15)
BUN: 15 mg/dL (ref 6–20)
CHLORIDE: 104 mmol/L (ref 98–111)
CO2: 21 mmol/L — AB (ref 22–32)
CREATININE: 1.12 mg/dL — AB (ref 0.44–1.00)
Calcium: 7.9 mg/dL — ABNORMAL LOW (ref 8.9–10.3)
GFR calc Af Amer: >60 mL/min (ref >60)
GLUCOSE: 79 mg/dL (ref 70–99)
POTASSIUM: 3.4 mmol/L — AB (ref 3.5–5.1)
SODIUM: 134 mmol/L — AB (ref 135–145)
Total Bilirubin: 1.3 mg/dL — ABNORMAL HIGH (ref 0.3–1.2)
Total Protein: 4.5 g/dL — ABNORMAL LOW (ref 6.5–8.1)

## 2018-01-20 LAB — CBC
HCT: 22.9 % — ABNORMAL LOW (ref 36.0–46.0)
Hemoglobin: 7.7 g/dL — ABNORMAL LOW (ref 12.0–15.0)
MCH: 26.4 pg (ref 26.0–34.0)
MCHC: 33.6 g/dL (ref 30.0–36.0)
MCV: 78.4 fL (ref 78.0–100.0)
PLATELETS: 126 10*3/uL — AB (ref 150–400)
RBC: 2.92 MIL/uL — AB (ref 3.87–5.11)
RDW: 15.9 % — AB (ref 11.5–15.5)
WBC: 11.1 10*3/uL — ABNORMAL HIGH (ref 4.0–10.5)

## 2018-01-20 NOTE — Progress Notes (Signed)
POSTPARTUM PROGRESS NOTE  Post Operative Day 1 Subjective:  Christina Jennings is a 27 y.o. [id number] 6536w2d s/p LTCS for arrest of descent.  No acute events overnight.  She states her legs feel unsteady when she gets up to walk, and she has tightness around her incision site. Pt denies problems with  voiding or po intake.  She denies nausea or vomiting.  Pain is moderately controlled.  She has had flatus. She has not had bowel movement.  Lochia Moderate.   Objective: Blood pressure 93/62, pulse 66, temperature 98.2 F (36.8 C), temperature source Oral, resp. rate 16, height 5' (1.524 m), weight 77.6 kg, last menstrual period 04/12/2017, SpO2 99 %, unknown if currently breastfeeding.  Physical Exam:  General: alert, cooperative and no distress Lochia:normal flow Chest: no respiratory distress Abdomen: soft, nontender,  Uterine Fundus: firm, appropriately tender DVT Evaluation: No calf swelling or tenderness Extremities: no edema  RecentLabs(last2labs)  Recent Labs    01/19/18 1049 01/20/18 0527  HGB 8.9* 7.7*  HCT 25.9* 22.9*      Assessment/Plan:  ASSESSMENT: Christina Jennings is a 27 y.o. G2X5284G4P2022 5036w2d s/p LTCS for arrest of descent. Patient had a Creatinine of 1.49 yesterday morning.  Will repeat CMET this AM.    Plan for discharge tomorrow, Breastfeeding and Contraception unsure   LOS: 3 days   Jackelyn KnifeDaniel K OlsonMD 01/20/2018, 8:33 AM

## 2018-01-20 NOTE — Lactation Note (Signed)
This note was copied from a baby's chart. Lactation Consultation Note Baby 5839 hrs old. Mom is mainly formula feeding baby. Mom has BF some and pumped a few times. Mom stated she wants to pump and bottle feed baby verse BF. Discussed supply and demand, pumping every 3 hrs, breast massage and hand expression after pumping. Mom doesn't have a DEBP at home. Mom has WIC. D[facility] renting a pump, [facility] and getting [facility]IC loaner at d/c home. LC faxing WIC referral.  FOB had several questions. LC encouraged STS to cont.  Encouraged to call for assistance or questions. Discussed mature milk and storage.  Patient Name: Christina Jennings [id number]'YToday's Date: 01/20/2018 Reason for consult: Follow-up assessment   Maternal Data    Feeding Feeding Type: Bottle Fed - Formula Nipple Type: Slow - flow  LATCH Score       Type of Nipple: Everted at rest and after stimulation  Comfort (Breast/Nipple): Soft / non-tender        Interventions Interventions: DEBP;Breast compression;Breast massage  Lactation Tools Discussed/Used     Consult Status Consult Status: Follow-up Date: 01/21/18 Follow-up type: In-patient    [name], [name] 01/20/2018, 10:10 PM

## 2018-01-21 DIAGNOSIS — O411290 Chorioamnionitis, unspecified trimester, not applicable or unspecified: Secondary | ICD-10-CM

## 2018-01-21 DIAGNOSIS — Z98891 History of uterine scar from previous surgery: Secondary | ICD-10-CM

## 2018-01-21 HISTORY — DX: Chorioamnionitis, unspecified trimester, not applicable or unspecified: O41.1290

## 2018-01-21 MED ORDER — SODIUM CHLORIDE 0.9 % IV SOLN
510.0000 mg | Freq: Once | INTRAVENOUS | Status: AC
  Administered 2018-01-21: 510 mg via INTRAVENOUS
  Filled 2018-01-21: qty 17

## 2018-01-21 MED ORDER — LACTATED RINGERS IV BOLUS
250.0000 mL | Freq: Once | INTRAVENOUS | Status: AC
  Administered 2018-01-21: 250 mL via INTRAVENOUS

## 2018-01-21 MED ORDER — OXYCODONE HCL 5 MG PO TABS: 5.0000 mg | ORAL_TABLET | Freq: Four times a day (QID) | ORAL | 0 refills | Status: DC | PRN

## 2018-01-21 MED ORDER — IBUPROFEN 600 MG PO TABS: 600.0000 mg | ORAL_TABLET | Freq: Four times a day (QID) | ORAL | 0 refills | Status: DC | PRN

## 2018-01-21 NOTE — Progress Notes (Signed)
Subjective: Postpartum Day 2: Cesarean Delivery Patient reports incisional pain, tolerating PO and + flatus.    Objective: Vital signs in last 24 hours: Temp:  [97.9 F (36.6 C)-98.4 F (36.9 C)] 98.4 F (36.9 C) (08/11 0645) Pulse Rate:  [68-72] 68 (08/11 0645) Resp:  [14-18] 14 (08/11 0645) BP: (95-158)/(60-82) 110/67 (08/11 0645) SpO2:  [98 %-100 %] 100 % (08/11 0645)  Physical Exam:  General: alert, cooperative, appears stated age, fatigued and no distress Lochia: appropriate Uterine Fundus: firm Incision: healing well, no significant drainage, no dehiscence, no significant erythema DVT Evaluation: No evidence of DVT seen on physical exam.  Recent Labs    01/19/18 1049 01/20/18 0527  HGB 8.9* 7.7*  HCT 25.9* 22.9*    Assessment/Plan: Status post Cesarean section. Doing well postoperatively. Hgb 7.7 will give fereheme  Continue current care.  Christina Jennings 01/21/2018, 8:57 AM

## 2018-01-21 NOTE — Progress Notes (Signed)
CNM called to discuss pt status post iron infusion reaction.  Orders given to bolus with LR, allow pt to eat then restart infusion at slower rate.  Pt is now comfortable and symptoms have resolved.  VS WNL.

## 2018-01-21 NOTE — Progress Notes (Signed)
Patient called out on call bell stating she couldn't breathe. Upon entering room patient was crying, anxious stating she couldn't breathe and had back pain. Iron infusion stopped. Relaxation techniques used to breathe, O2 set up but not needed, VS obtained and WNL. Respirations 20, easy, lung sounds clear, O2 sat. 100%. Patient stated symptoms disappeared within 5 min. Nurse assigned to patient Broadus JohnAbbey Polos RN  started LR infusion @ 1230.

## 2018-01-21 NOTE — Discharge Summary (Addendum)
OB Discharge Summary     Patient Name: Vaishali Baise DOB: 1990-06-24 MRN: [id number]  Date of admission: 01/17/2018 Delivering MD: Conan Bowens   Date of discharge: 01/21/2018  Admitting diagnosis: 40WKS,LABOR Intrauterine pregnancy: [redacted]w[redacted]d     Secondary diagnosis:  Active Problems:   Supervision of other normal pregnancy, antepartum   Late prenatal care affecting pregnancy   Anemia affecting pregnancy, antepartum   Polyhydramnios affecting pregnancy in third trimester   Polyhydramnios in third trimester   Chorioamnionitis   S/P cesarean section   Arrest of dilation, delivered, current hospitalization      Discharge diagnosis: C/S delivered                                                                                                Post partum procedures:None  Augmentation: AROM, Pitocin, Cytotec and Foley Balloon  Complications: Arrest of descent, chorioamnionitis, PPH (EBL 1500cc)  Hospital course:  Induction of Labor With Cesarean Section  27 y.o. yo [id number] at [redacted]w[redacted]d was admitted to the hospital 01/17/2018 for induction of labor. Patient had a labor course significant for arrest odf dilation in the second stage. The patient went for cesarean section due to Arrest of Descent, and delivered a Viable infant,01/19/2018  Membrane Rupture Time/Date: 6:50 AM ,01/18/2018   Details of operation can be found in separate operative Note.  Patient had an uncomplicated postpartum course. She is ambulating, tolerating a regular diet, passing flatus, and urinating well.  Patient is discharged home in stable condition on 01/21/18.                                    Physical exam  Vitals:   01/20/18 1313 01/20/18 1320 01/21/18 0010 01/21/18 0645  BP: (!) 158/82 95/60 101/64 110/67  Pulse: 71 71 72 68  Resp: 18  18 14   Temp: 98.1 F (36.7 C)  97.9 F (36.6 C) 98.4 F (36.9 C)  TempSrc: Oral  Oral Oral  SpO2: 99%  98% 100%  Weight:      Height:       General: alert, cooperative and no  distress Lochia: appropriate Uterine Fundus: firm Incision: Healing well with no significant drainage, No significant erythema, Dressing is clean, dry, and intact DVT Evaluation: No evidence of DVT seen on physical exam.  Labs: Lab Results  Component Value Date   WBC 11.1 (H) 01/20/2018   HGB 7.7 (L) 01/20/2018   HCT 22.9 (L) 01/20/2018   MCV 78.4 01/20/2018   PLT 126 (L) 01/20/2018   CMP Latest Ref Rng & Units 01/20/2018  Glucose 70 - 99 mg/dL 79  BUN 6 - 20 mg/dL 15  Creatinine 2.95 - 6.21 mg/dL 3.08(M)  Sodium 578 - 469 mmol/L 134(L)  Potassium 3.5 - 5.1 mmol/L 3.4(L)  Chloride 98 - 111 mmol/L 104  CO2 22 - 32 mmol/L 21(L)  Calcium 8.9 - 10.3 mg/dL 7.9(L)  Total Protein 6.5 - 8.1 g/dL 6.2(X)  Total Bilirubin 0.3 - 1.2 mg/dL 5.2(W)  Alkaline Phos 38 - 126  U/L 83  AST 15 - 41 U/L 28  ALT 0 - 44 U/L 11    Discharge instruction: per After Visit Summary and "Baby and Me Booklet".  After visit meds:  Allergies as of 01/21/2018   No Known Allergies     Medication List    TAKE these medications   COMFORT FIT MATERNITY SUPP MED Misc One maternity belly support band   ferrous sulfate 325 (65 FE) MG tablet Take 1 tablet (325 mg total) by mouth 2 (two) times daily with a meal.   ibuprofen 600 MG tablet Commonly known as:  ADVIL,MOTRIN Take 1 tablet (600 mg total) by mouth every 6 (six) hours as needed for mild pain.   oxyCODONE 5 MG immediate release tablet Commonly known as:  Oxy IR/ROXICODONE Take 1 tablet (5 mg total) by mouth every 6 (six) hours as needed (breakthrough pain not relieved by ibuprofen and tylenol).   prenatal multivitamin Tabs tablet Take 1 tablet by mouth daily at 12 noon.   ranitidine 150 MG tablet Commonly known as:  ZANTAC Take 1 tablet (150 mg total) by mouth at bedtime.       Diet: routine diet  Activity: Advance as tolerated. Pelvic rest for 6 weeks.   Outpatient follow up:4 weeks Follow up Appt:No future appointments. Follow up  Visit:No follow-ups on file.  Postpartum contraception: Undecided  Newborn Data: Live born female  Birth Weight: 8 lb 8.7 oz (3875 g) APGAR: 8, 9  Newborn Delivery   Birth date/time:  01/19/2018 07:04:00 Delivery type:  C-Section, Low Transverse Trial of labor:  Yes C-section categorization:  Primary     Baby Feeding: Bottle Disposition:home with mother   01/21/2018 Candis SchatzPatricia Dell, MD   I examined this pt and and agree with above assessment

## 2018-01-22 LAB — CBC
HCT: 21.5 % — ABNORMAL LOW (ref 36.0–46.0)
Hemoglobin: 7.1 g/dL — ABNORMAL LOW (ref 12.0–15.0)
MCH: 26 pg (ref 26.0–34.0)
MCHC: 33 g/dL (ref 30.0–36.0)
MCV: 78.8 fL (ref 78.0–100.0)
PLATELETS: 140 10*3/uL — AB (ref 150–400)
RBC: 2.73 MIL/uL — AB (ref 3.87–5.11)
RDW: 16.3 % — AB (ref 11.5–15.5)
WBC: 7.1 10*3/uL (ref 4.0–10.5)

## 2018-01-22 MED ORDER — OXYCODONE HCL 5 MG PO TABS: 5.0000 mg | ORAL_TABLET | Freq: Four times a day (QID) | ORAL | 0 refills | Status: DC | PRN

## 2018-01-22 MED ORDER — IBUPROFEN 600 MG PO TABS: 600.0000 mg | ORAL_TABLET | Freq: Four times a day (QID) | ORAL | 0 refills | Status: DC | PRN

## 2018-01-22 NOTE — Discharge Summary (Addendum)
OB Discharge Summary     Patient Name: Christina Jennings DOB: 05-10-1991 MRN: [id number]  Date of admission: 01/17/2018 Delivering MD: Conan BowensAVIS, KELLY M   Date of discharge: 01/22/2018  Admitting diagnosis: 40WKS,LABOR Intrauterine pregnancy: 5175w2d     Secondary diagnosis:  Active Problems:   Supervision of other normal pregnancy, antepartum   Late prenatal care affecting pregnancy   Anemia affecting pregnancy, antepartum   Polyhydramnios affecting pregnancy in third trimester   Polyhydramnios in third trimester   Chorioamnionitis   S/P cesarean section   Arrest of dilation, delivered, current hospitalization  Additional problems: none     Discharge diagnosis: Term Pregnancy Delivered                                                                                                Post partum procedures:n/a  Augmentation: AROM, Pitocin, Cytotec and Foley Balloon  Complications: Hemorrhage>107100mL  Hospital course:  Induction of Labor With Cesarean Section  27 y.o. yo [id number] at 4975w2d was admitted to the hospital 01/17/2018 for induction of labor. Patient had a labor course significant for arrest of descent, chorio. The patient went for cesarean section due to Arrest of Descent, and delivered a Viable infant,01/19/2018  Membrane Rupture Time/Date: 6:50 AM ,01/18/2018   Details of operation can be found in separate operative Note.  Patient had an uncomplicated postpartum course.  EBL 1500ml.. She is ambulating, tolerating a regular diet, passing flatus, and urinating well.  Patient is discharged home in stable condition on 01/22/18.                                    Physical exam  Vitals:   01/21/18 1245 01/21/18 1447 01/21/18 2122 01/22/18 0611  BP: 107/67 113/68 90/76 103/65  Pulse: (!) 56 (!) 58 65 (!) 57  Resp: 18 18  16   Temp: 98.4 F (36.9 C) 98.1 F (36.7 C) 98.6 F (37 C) 97.8 F (36.6 C)  TempSrc: Oral Oral Oral Oral  SpO2: 100%  98% 95%  Weight:      Height:       General:  alert, cooperative and no distress Lochia: appropriate Uterine Fundus: firm Incision: Healing well with no significant drainage DVT Evaluation: No evidence of DVT seen on physical exam. Labs: Lab Results  Component Value Date   WBC 7.1 01/22/2018   HGB 7.1 (L) 01/22/2018   HCT 21.5 (L) 01/22/2018   MCV 78.8 01/22/2018   PLT 140 (L) 01/22/2018   CMP Latest Ref Rng & Units 01/20/2018  Glucose 70 - 99 mg/dL 79  BUN 6 - 20 mg/dL 15  Creatinine 1.470.44 - 8.291.00 mg/dL 5.62(Z1.12(H)  Sodium 308135 - 657145 mmol/L 134(L)  Potassium 3.5 - 5.1 mmol/L 3.4(L)  Chloride 98 - 111 mmol/L 104  CO2 22 - 32 mmol/L 21(L)  Calcium 8.9 - 10.3 mg/dL 7.9(L)  Total Protein 6.5 - 8.1 g/dL 8.4(O4.5(L)  Total Bilirubin 0.3 - 1.2 mg/dL 9.6(E1.3(H)  Alkaline Phos 38 - 126 U/L 83  AST 15 - 41 U/L  28  ALT 0 - 44 U/L 11    Discharge instruction: per After Visit Summary and "Baby and Me Booklet".  After visit meds:  Allergies as of 01/22/2018   No Known Allergies     Medication List    TAKE these medications   COMFORT FIT MATERNITY SUPP MED Misc One maternity belly support band   ferrous sulfate 325 (65 FE) MG tablet Take 1 tablet (325 mg total) by mouth 2 (two) times daily with a meal.   ibuprofen 600 MG tablet Commonly known as:  ADVIL,MOTRIN Take 1 tablet (600 mg total) by mouth every 6 (six) hours as needed for mild pain.   oxyCODONE 5 MG immediate release tablet Commonly known as:  Oxy IR/ROXICODONE Take 1 tablet (5 mg total) by mouth every 6 (six) hours as needed (breakthrough pain not relieved by ibuprofen and tylenol).   prenatal multivitamin Tabs tablet Take 1 tablet by mouth daily at 12 noon.   ranitidine 150 MG tablet Commonly known as:  ZANTAC Take 1 tablet (150 mg total) by mouth at bedtime.       Diet: routine diet  Activity: Advance as tolerated. Pelvic rest for 6 weeks.   Outpatient follow up:1 week Follow up Appt:No future appointments. Follow up Visit:No follow-ups on file.  Postpartum  contraception: Undecided and None.   Newborn Data: Live born female  Birth Weight: 8 lb 8.7 oz (3875 g) APGAR: 8, 9  Newborn Delivery   Birth date/time:  01/19/2018 07:04:00 Delivery type:  C-Section, Low Transverse Trial of labor:  Yes C-section categorization:  Primary     Baby Feeding: Breast Disposition:home with mother   01/22/2018 Sandre Kittyaniel K Olson, MD  RESIDENT ADDENDUM I have separately seen and examined the patient. I have discussed the findings and exam with the medical student and agree with the above note. I helped develop the management plan that is described in the student's note, and I agree with the content.  Christina Jennings, [place] 01/22/18  2:37 PM

## 2018-01-22 NOTE — Discharge Instructions (Signed)

## 2018-01-24 ENCOUNTER — Telehealth: Payer: Self-pay | Admitting: General Practice

## 2018-01-24 NOTE — Telephone Encounter (Signed)
Left message on VM for patient to give office a call back in regards to appt scheduled for 02/07/18 at 10:15am for incision check.

## 2018-02-07 ENCOUNTER — Ambulatory Visit (INDEPENDENT_AMBULATORY_CARE_PROVIDER_SITE_OTHER): Payer: Medicaid Other

## 2018-02-07 VITALS — Wt 143.9 lb

## 2018-02-07 DIAGNOSIS — Z5189 Encounter for other specified aftercare: Secondary | ICD-10-CM

## 2018-02-07 MED ORDER — IBUPROFEN 600 MG PO TABS: 600.0000 mg | ORAL_TABLET | Freq: Four times a day (QID) | ORAL | 0 refills | Status: DC | PRN

## 2018-02-07 NOTE — Progress Notes (Signed)
Pt complains of pain on right side of abdomen. Incision looks clean and is healing appropriately. Pt requests refill her ibuprofen 600 MG tab. Advised pt to schedule her postpartum appointment and if she has any pain to give us a call. Pt verbalized understanding.

## 2018-02-07 NOTE — Progress Notes (Signed)
I have reviewed this chart and agree with the RN/CMA assessment and management.    K. Meryl Davis, M.D. [facility]  

## 2018-02-28 ENCOUNTER — Other Ambulatory Visit (HOSPITAL_COMMUNITY)
Admission: RE | Admit: 2018-02-28 | Discharge: 2018-02-28 | Disposition: A | Discharge status: Home / Self Care | Payer: Medicaid Other | Source: Ambulatory Visit | Attending: Family Medicine | Admitting: Family Medicine

## 2018-02-28 ENCOUNTER — Encounter: Payer: Self-pay | Admitting: Family Medicine

## 2018-02-28 ENCOUNTER — Ambulatory Visit (INDEPENDENT_AMBULATORY_CARE_PROVIDER_SITE_OTHER): Payer: Medicaid Other | Admitting: Family Medicine

## 2018-02-28 ENCOUNTER — Encounter: Payer: Self-pay | Admitting: *Deleted

## 2018-02-28 DIAGNOSIS — Z1389 Encounter for screening for other disorder: Secondary | ICD-10-CM

## 2018-02-28 DIAGNOSIS — M778 Other enthesopathies, not elsewhere classified: Secondary | ICD-10-CM | POA: Diagnosis not present

## 2018-02-28 DIAGNOSIS — D500 Iron deficiency anemia secondary to blood loss (chronic): Secondary | ICD-10-CM | POA: Diagnosis not present

## 2018-02-28 DIAGNOSIS — G5601 Carpal tunnel syndrome, right upper limb: Secondary | ICD-10-CM | POA: Diagnosis not present

## 2018-02-28 DIAGNOSIS — N898 Other specified noninflammatory disorders of vagina: Secondary | ICD-10-CM | POA: Diagnosis not present

## 2018-02-28 DIAGNOSIS — D509 Iron deficiency anemia, unspecified: Secondary | ICD-10-CM

## 2018-02-28 NOTE — Progress Notes (Signed)
Pt states when she lays down having pain in her Vagina. Pt states is not interested in Birth Control at this time because she is single, will look at later.

## 2018-02-28 NOTE — Progress Notes (Addendum)
Subjective:     Christina Jennings is a 27 y.o. female who presents for a postpartum visit. She is 5.5 weeks postpartum following a low cervical transverse Cesarean section. I have fully reviewed the prenatal and intrapartum course. The delivery was at 40/2 gestational weeks. Outcome: primary cesarean section, low transverse incision. Anesthesia: spinal. Postpartum course has been relatively uncomplicated though she is complaining today of vaginal pain and right wrist pain. Baby's course has been normal, gaining weight well. She stopped breastfeeding because she "just wasn't feeling it." Father of baby is involved, feels like she has good support. Baby is feeding by bottle - Gerber Gentle. Bleeding staining only. Bowel function is normal. Bladder function is normal. Patient is not sexually active. Contraception method is none. Postpartum depression screening: negative.  - Pain at night in vagina  - occasional pain along incision - Carpel tunnel - [facility] to [facility] ([facility]) - pregnant just left hand, then both hands, tingling gone, now feels like bone in right wrist out of place  - Arrest of descent, chorio - C/S - Anemia (HgB 7.1), denies lightheadedness, dizziness - received feraheme in hospital, not taking iron  - mood is stable, has good support, not interested in any relationship, doing okay adjusting to changes in sleep patterns   The following portions of the patient's history were reviewed and updated as appropriate: allergies, current medications, past family history, past medical history, past social history, past surgical history and problem list.  Review of Systems Pertinent items are noted in HPI.   Objective:    BP 106/70   Pulse 73   Ht 5' (1.524 m)   Wt 148 lb (67.1 kg)   Breastfeeding? No   BMI 28.90 kg/m   General:  alert, cooperative and well-appearing   Breasts:  deferred  Lungs: no respiratory distress  Heart:  regular rate  Abdomen: no TTP, incision  healing well   Vulva:  normal  Vagina: normal vagina with yellow/white discharge  Cervix:  normal appearing, yellow/white discharge from os  Corpus: not examined  Adnexa:  not evaluated  Rectal Exam: Not performed.        Assessment:     Routine postpartum exam. Pap smear not done at today's visit.   Plan:   1. Contraception: abstinence,. Counseled on restarting birth control prior to becoming sexually active. She voiced understanding of plan. 2. Wrist Pain: Referred to family medicine to establish care and follow-up likely tendonitis from holding infant. Recommended trial of ice, NSAIDs, and neutral extension wrist splint.  3. Vaginal Discharge - follow-up wet prep  4. Iron Deficiency and Acute Blood Loss Anemia: Has not been taking iron. Asymptomatic. Will recheck CBC today as HgB~7 on discharge after delivery.  3. Follow-up as needed. Return to work paperwork completed.

## 2018-03-01 LAB — CERVICOVAGINAL ANCILLARY ONLY
Bacterial vaginitis: NEGATIVE
Candida vaginitis: NEGATIVE
TRICH (WINDOWPATH): NEGATIVE

## 2018-03-01 LAB — CBC
Hematocrit: 35.7 % (ref 34.0–46.6)
Hemoglobin: 11.4 g/dL (ref 11.1–15.9)
MCH: 25.4 pg — ABNORMAL LOW (ref 26.6–33.0)
MCHC: 31.9 g/dL (ref 31.5–35.7)
MCV: 80 fL (ref 79–97)
PLATELETS: 266 10*3/uL (ref 150–450)
RBC: 4.49 x10E6/uL (ref 3.77–5.28)
RDW: 15.3 % (ref 12.3–15.4)
WBC: 4 10*3/uL (ref 3.4–10.8)

## 2018-11-05 IMAGING — US US MFM OB FOLLOW-UP
1 series · 14 of 28 positions shown · non-contrast
Comparison: none

[Series 1: us mfm ob follow-up · 40 acquisitions, 14 frames shown]
[im 2/40]
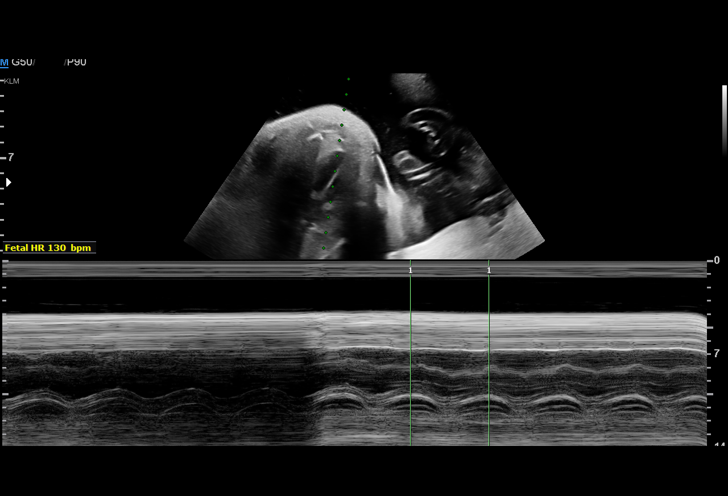
[im 5/40]
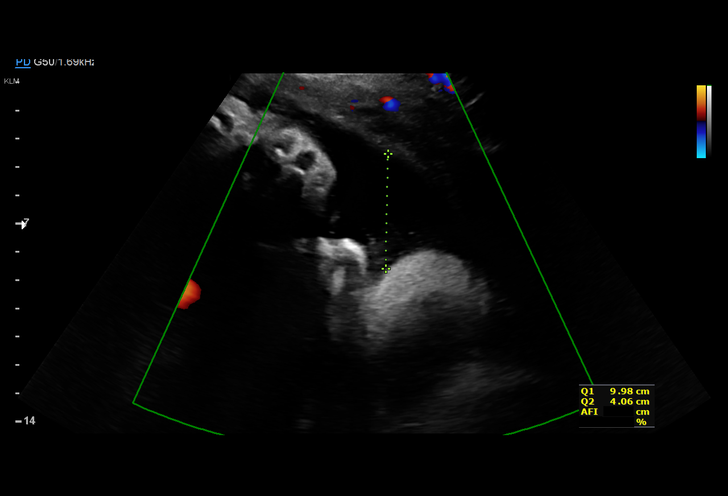
[im 8/40]
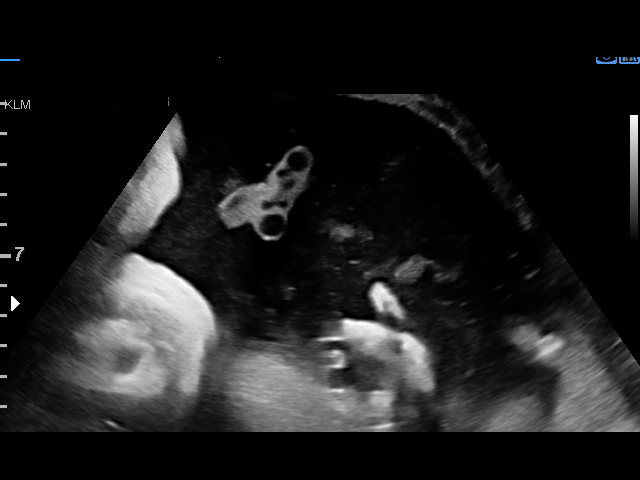
[im 11/40]
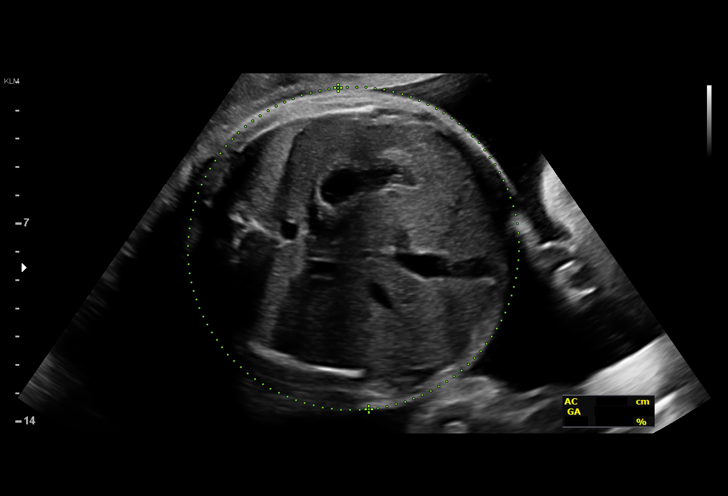
[im 14/40]
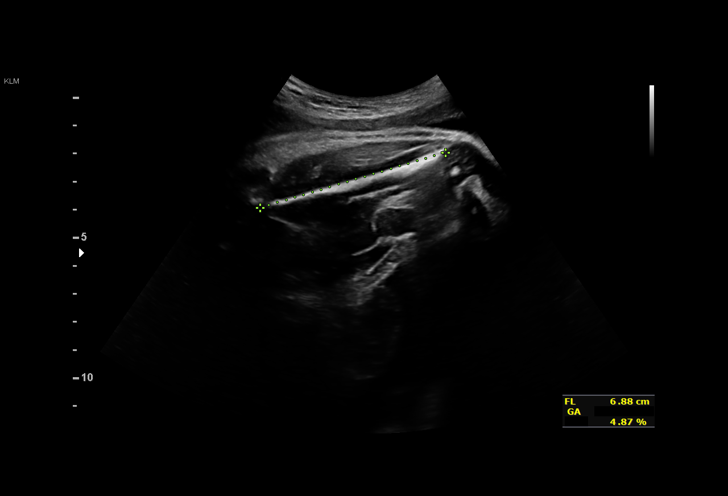
[im 16/40]
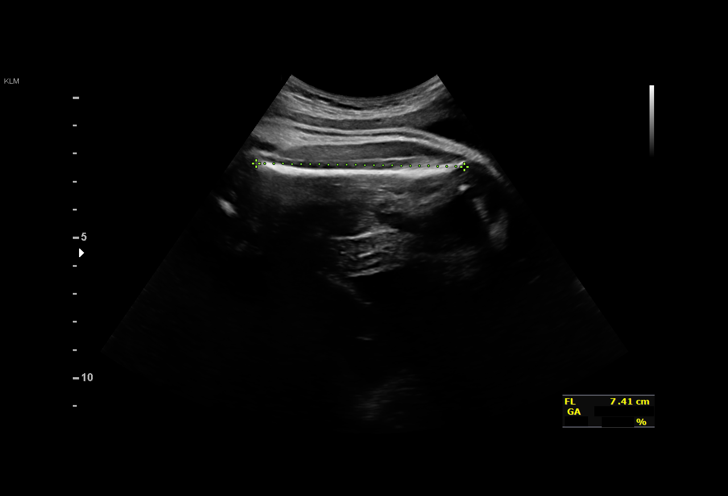
[im 19/40]
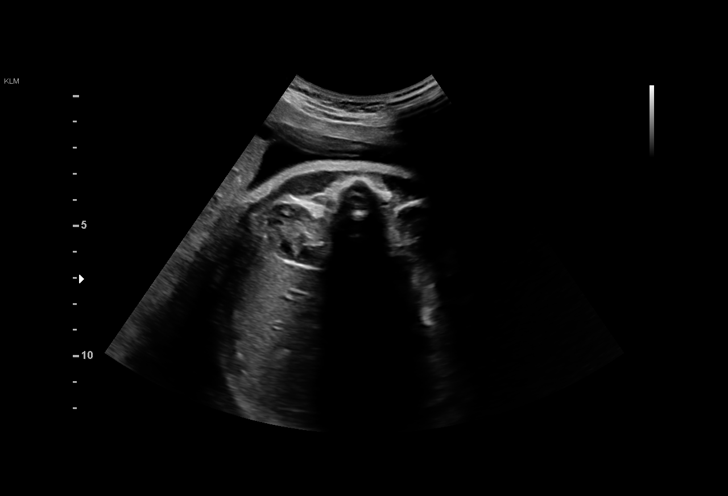
[im 22/40]
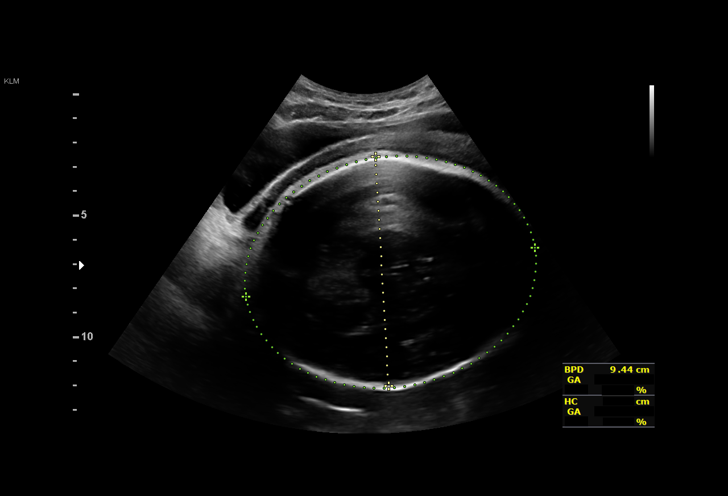
[im 25/40]
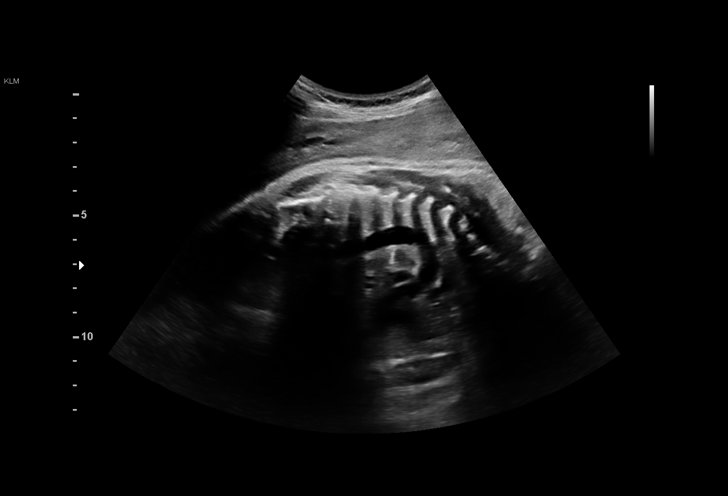
[im 28/40]
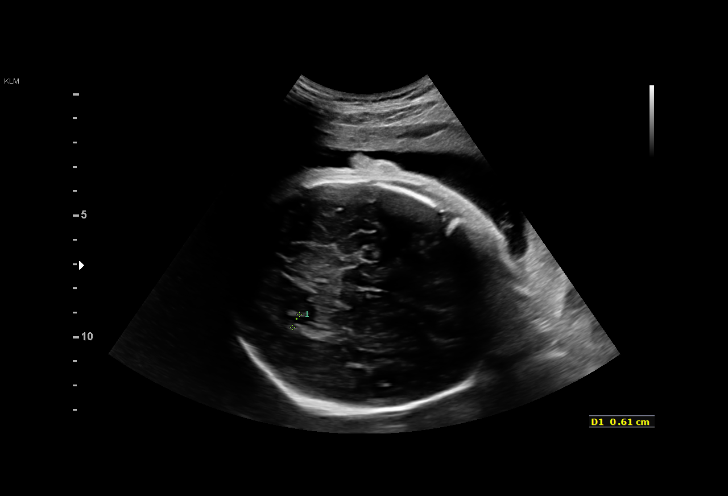
[im 31/40]
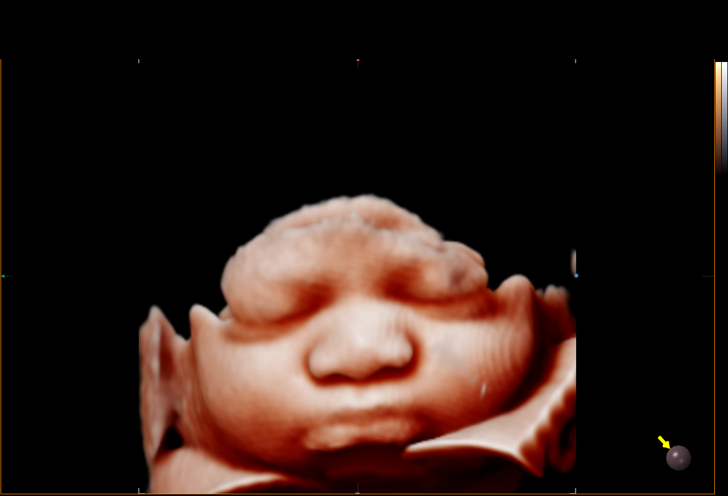
[im 34/40]
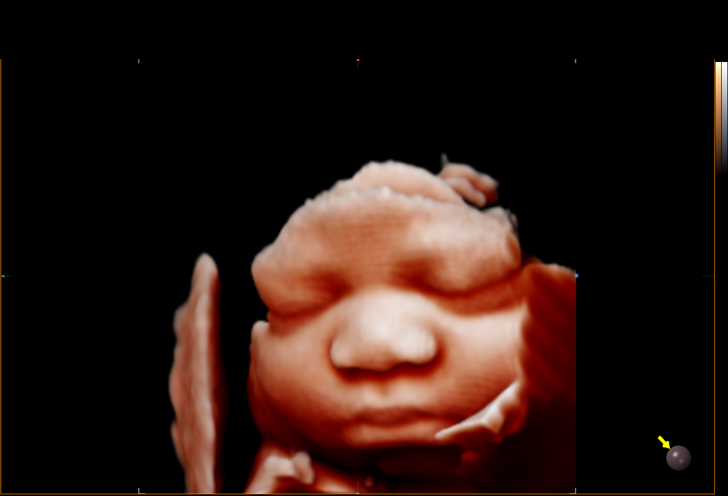
[im 37/40]
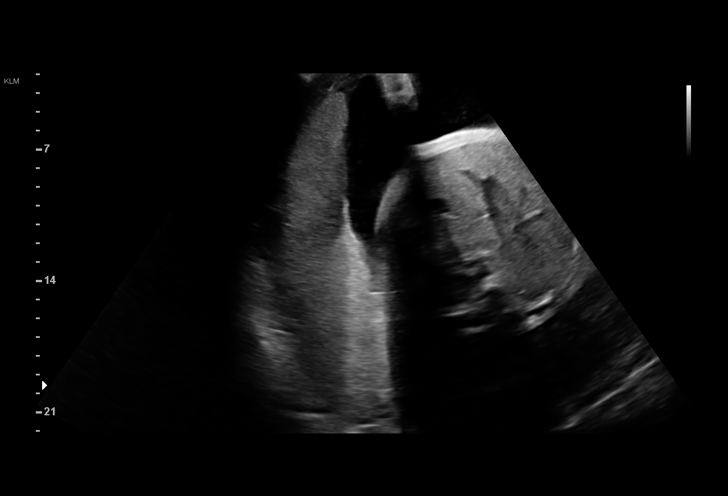
[im 40/40]
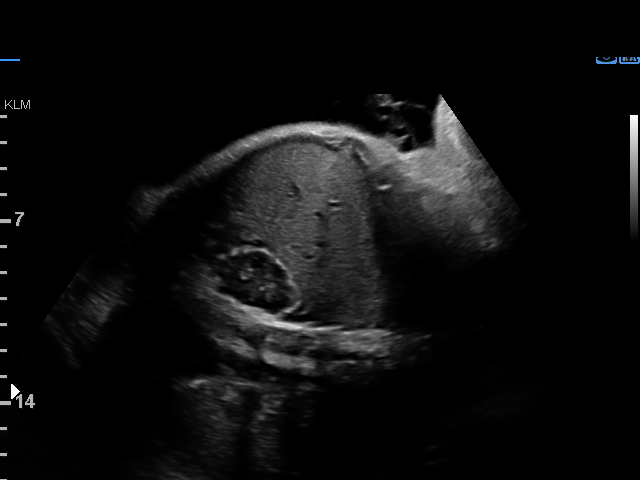

[14 of 28 positions shown; findings below may reference images not displayed]

OB/Gyn Clinic

1  YUSNIEL MATLOCK            [id number]      [id number]     [id number]
2  YUSNIEL MATLOCK            [id number]      [id number]     [id number]
Indications

37 weeks gestation of pregnancy
Polyhydramnios, third trimester, antepartum
condition or complication, unspecified fetus
OB History

Gravidity:    4         Term:   1        Prem:   0        SAB:   2
TOP:          0       Ectopic:  0        Living: 1
Fetal Evaluation

Num Of Fetuses:     1
Fetal Heart         130
Rate(bpm):
Cardiac Activity:   Observed
Presentation:       Cephalic
Placenta:           Posterior
P. Cord Insertion:  Previously Visualized

Amniotic Fluid
AFI FV:      Polyhydramnios

AFI Sum(cm)     %Tile       Largest Pocket(cm)
25.17           96

RUQ(cm)       RLQ(cm)       LUQ(cm)        LLQ(cm)
9.98
Comment:    [DATE] BPP in 13 minutes.
Biophysical Evaluation

Amniotic F.V:   Polyhydramnios             F. Tone:        Observed
F. Movement:    Observed                   Score:          [DATE]
F. Breathing:   Observed
Biometry

BPD:      94.8  mm     G. Age:  38w 4d         88  %    CI:        76.93   %    70 - 86
FL/HC:      20.9   %    20.9 -
HC:      342.3  mm     G. Age:  39w 3d         68  %    HC/AC:      0.94        0.92 -
AC:      365.5  mm     G. Age:  40w 3d       > 97  %    FL/BPD:     75.5   %    71 - 87
FL:       71.6  mm     G. Age:  36w 5d         24  %    FL/AC:      19.6   %    20 - 24

Est. FW:    7325  gm      8 lb 5 oz   > 90  %
Gestational Age

LMP:           37w 6d        Date:  04/12/17                 EDD:   01/17/18
U/S Today:     38w 6d                                        EDD:   01/10/18
Best:          37w 6d     Det. By:  LMP  (04/12/17)          EDD:   01/17/18
Anatomy

Cranium:               Appears normal         Aortic Arch:            Appears normal
Cavum:                 Previously seen        Ductal Arch:            Appears normal
Ventricles:            Appears normal         Diaphragm:              Previously seen
Choroid Plexus:        Previously seen        Stomach:                Appears normal, left
sided
Cerebellum:            Previously seen        Abdomen:                Appears normal
Posterior Fossa:       Previously seen        Abdominal Wall:         Previously seen
Nuchal Fold:           Previously seen        Cord Vessels:           Previously seen
Face:                  Orbits and profile     Kidneys:                Appear normal
previously seen
Lips:                  Previously seen        Bladder:                Appears normal
Thoracic:              Appears normal         Spine:                  Previously seen
Heart:                 Previously seen        Upper Extremities:      Previously seen
RVOT:                  Previously seen        Lower Extremities:      Previously seen
LVOT:                  Previously seen

Other:  Fetus appears to be a male. Heels and 5th digit previously visualized.
Nasal bone previously visualized.
Cervix Uterus Adnexa

Cervix
Not visualized (advanced GA >61wks)
Impression

Idiopathic polyhydramnios. Patient does not have gestational
diabetes.
Mild polyhydramnios is seen again (AFI=25 cm). The
estimated fetal weight is at greater than the 90th percentile.
Abdominal circumference measures at greater than the 95th
percentile.
BPP [DATE].
Recommendations

Antenatal testing next week.
Consider delivery at 39 to 40 weeks if polyhydramnios
persists or earlier if maternal symptoms are present.

## 2018-11-14 IMAGING — US US FETAL BPP W/ NON-STRESS
1 series · 13 of 15 positions shown · non-contrast
Comparison: none

[Series 1: us fetal bpp w/nonstress · 15 acquisitions, 13 frames shown]
[im 1/15]
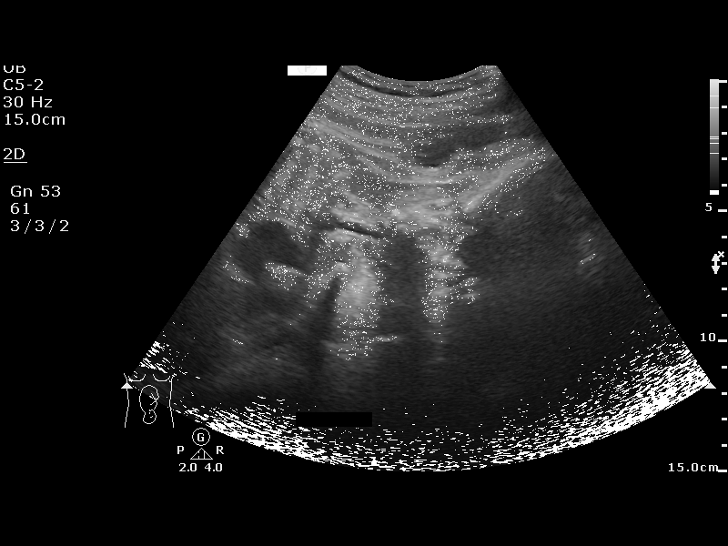
[im 2/15]
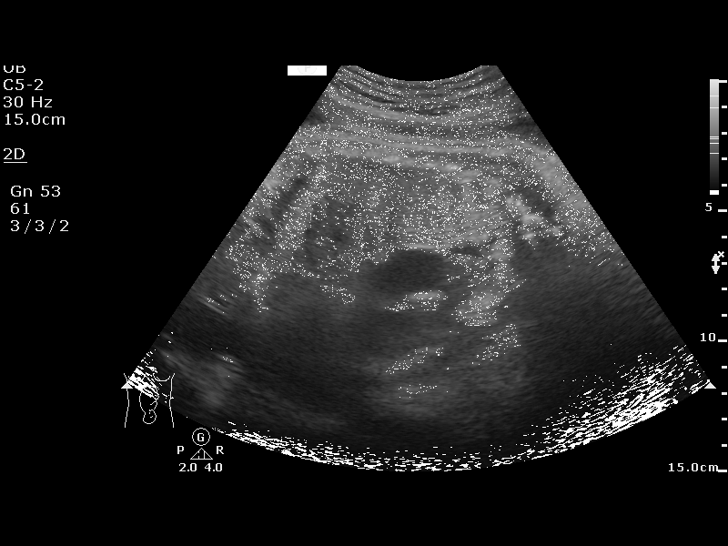
[im 3/15]
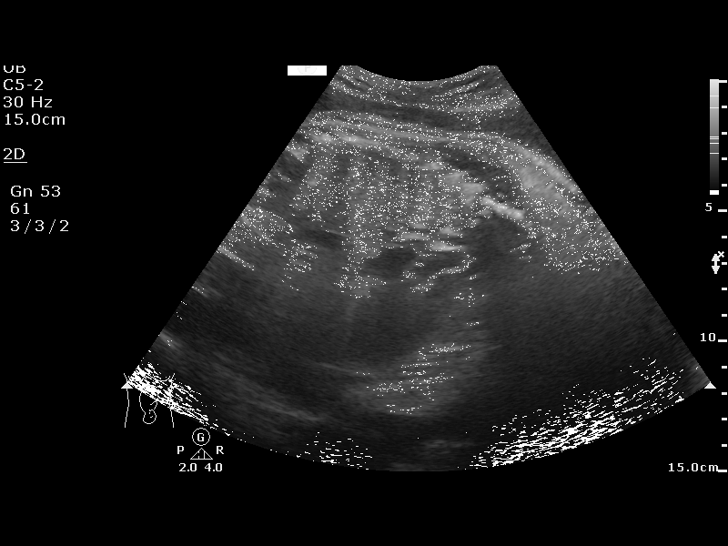
[im 5/15]
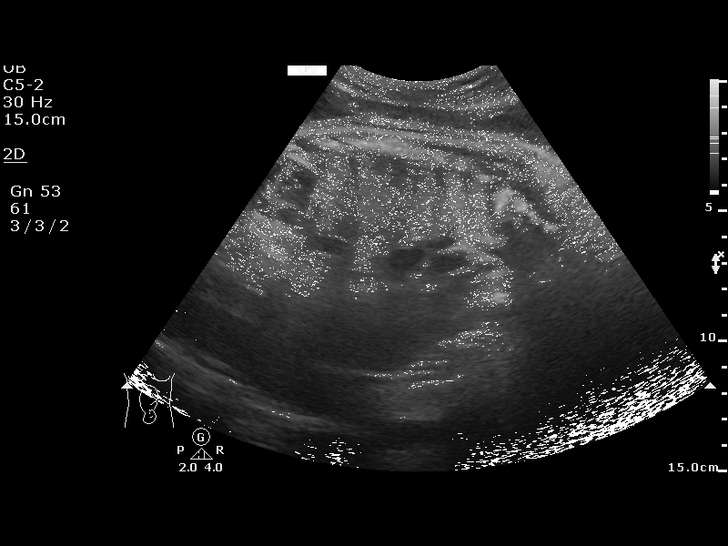
[im 6/15]
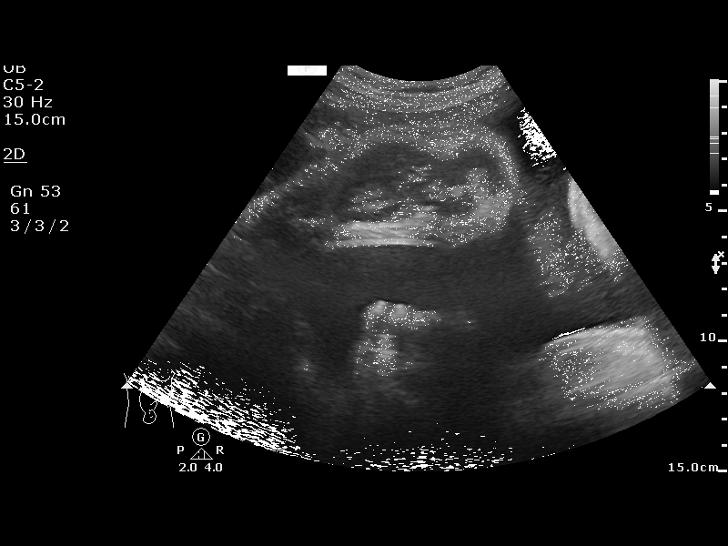
[im 7/15]
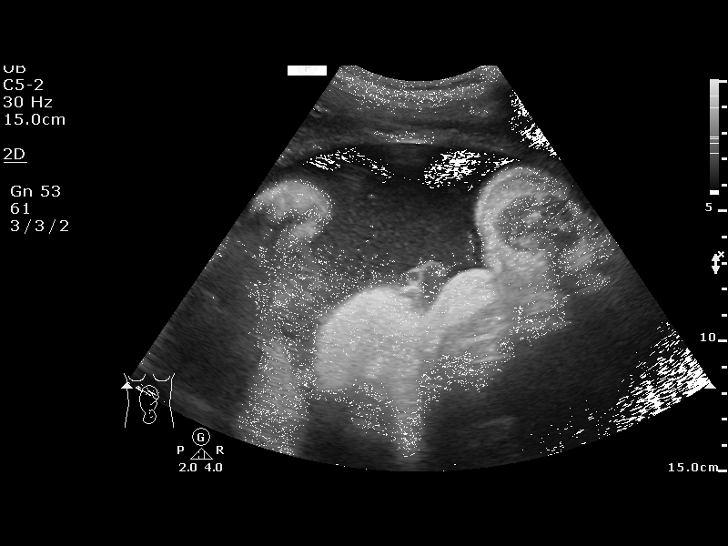
[im 8/15]
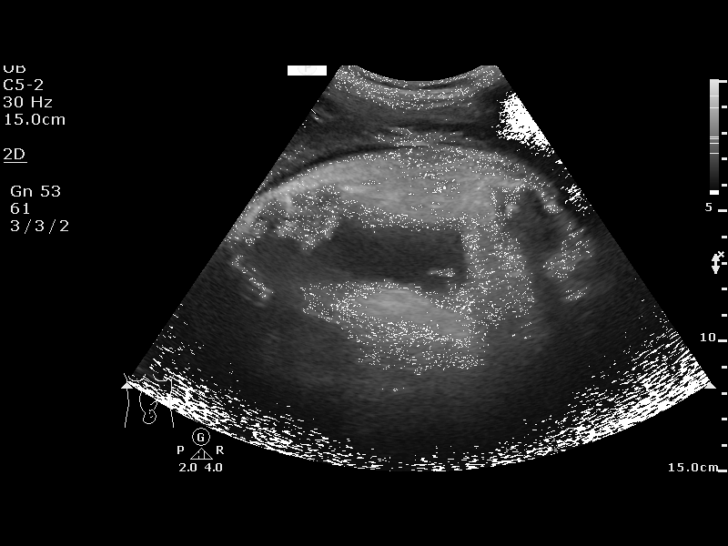
[im 9/15]
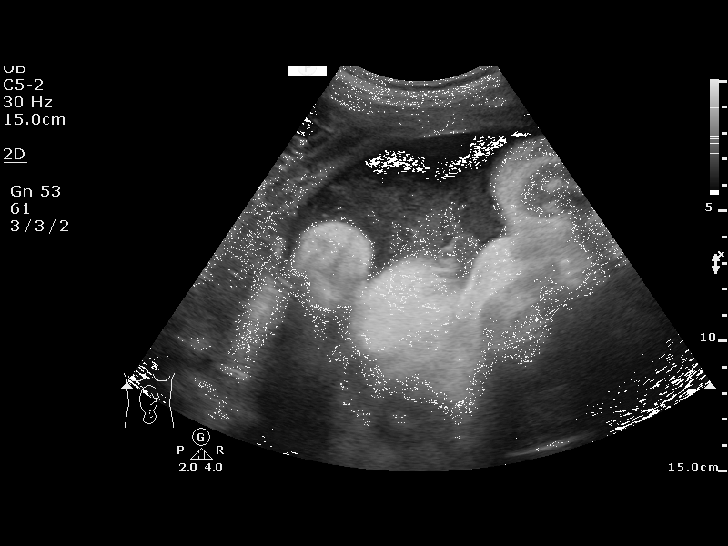
[im 10/15]
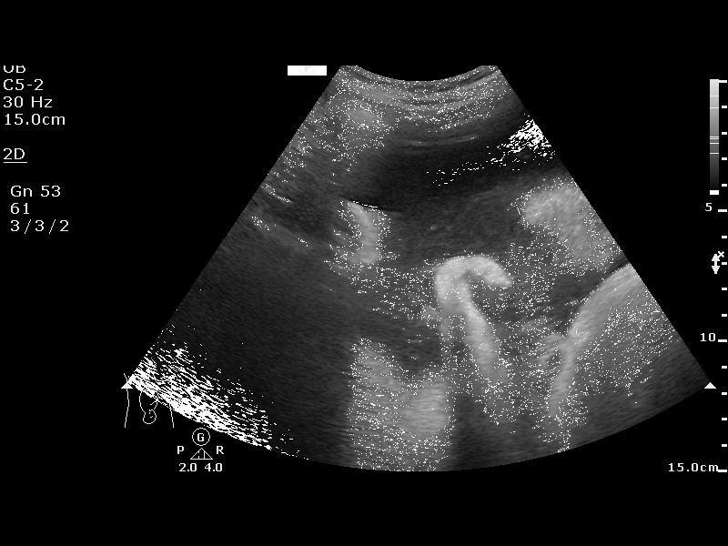
[im 11/15]
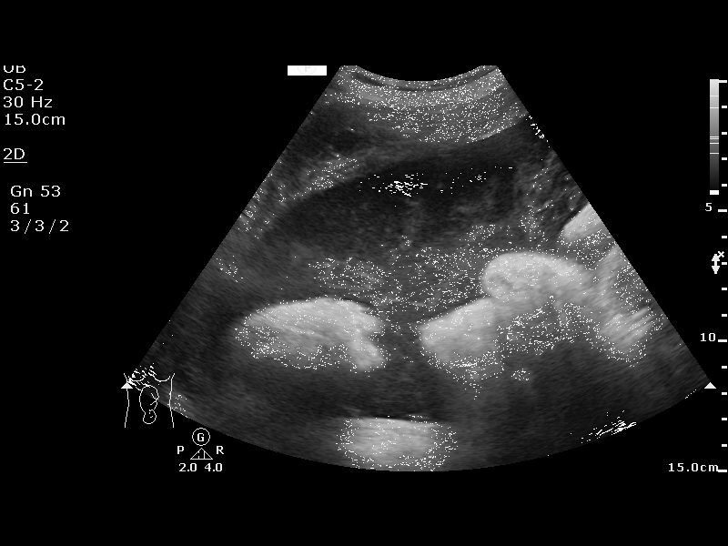
[im 13/15]
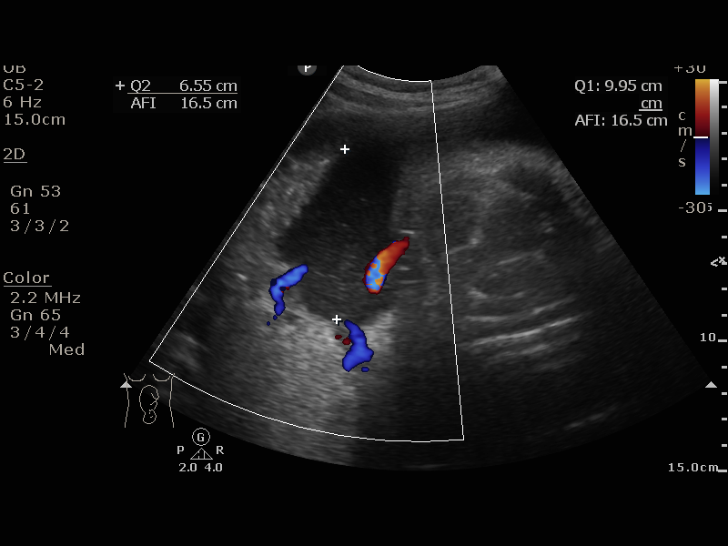
[im 14/15]
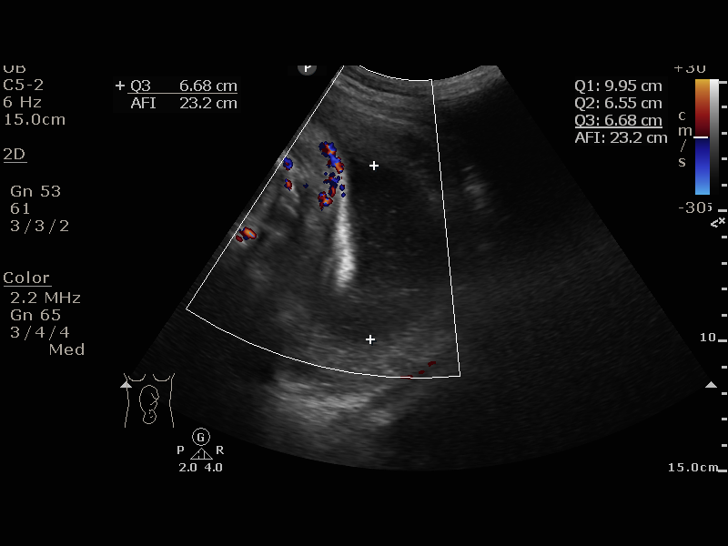
[im 15/15]
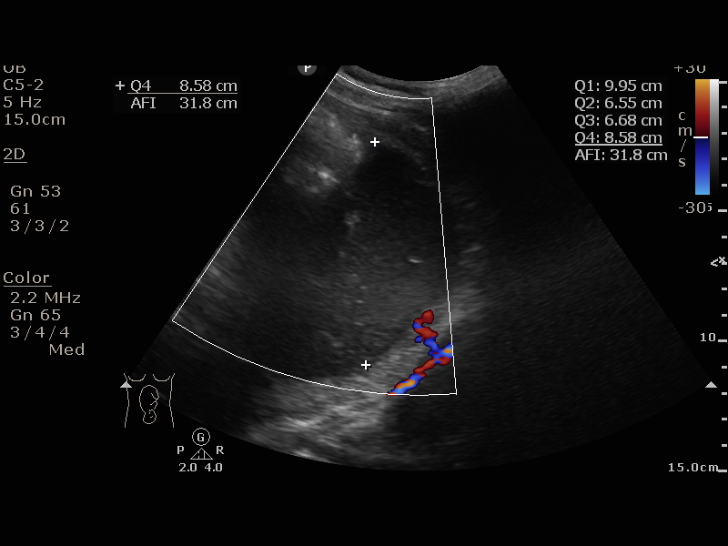

[13 of 15 positions shown; findings below may reference images not displayed]

OB/Gyn Clinic
[facility]
[REDACTED]

1  US FETAL BPP W/NONSTRESS                    76818.4

1  PAULUS N CEEJAY            [id number]      [id number]     [id number]
Service(s) Provided

Indications

39 weeks gestation of pregnancy
Polyhydramnios, third trimester, antepartum
condition or complication, unspecified fetus
OB History

Gravidity:    4         Term:   1        Prem:   0        SAB:   2
TOP:          0       Ectopic:  0        Living: 1
Fetal Evaluation

Num Of Fetuses:     1
Preg. Location:     Intrauterine
Cardiac Activity:   Observed
Presentation:       Cephalic

Amniotic Fluid
AFI FV:      Polyhydramnios

AFI Sum(cm)     %Tile       Largest Pocket(cm)
31.76           > 97
RUQ(cm)       RLQ(cm)       LUQ(cm)        LLQ(cm)
9.95
Biophysical Evaluation

Amniotic F.V:   Pocket => 2 cm two         F. Tone:        Observed
planes
F. Movement:    Observed                   N.S.T:          Reactive
F. Breathing:   Observed                   Score:          [DATE]
Gestational Age

LMP:           39w 1d        Date:  04/12/17                 EDD:   01/17/18
Best:          39w 1d     Det. By:  LMP  (04/12/17)          EDD:   01/17/18
Impression

Vertex
AFI
Recommendations

Continue with antenatal testing as indicated

## 2022-03-24 ENCOUNTER — Encounter (HOSPITAL_COMMUNITY): Payer: Self-pay | Admitting: *Deleted

## 2022-03-24 ENCOUNTER — Inpatient Hospital Stay (HOSPITAL_COMMUNITY): Payer: Medicaid Other

## 2022-03-24 ENCOUNTER — Other Ambulatory Visit: Payer: Self-pay

## 2022-03-24 ENCOUNTER — Inpatient Hospital Stay (HOSPITAL_COMMUNITY)
Admission: AD | Admit: 2022-03-24 | Discharge: 2022-03-24 | Disposition: A | Discharge status: Home / Self Care | Payer: Medicaid Other | Attending: Obstetrics & Gynecology | Admitting: Obstetrics & Gynecology

## 2022-03-24 DIAGNOSIS — O3680X1 Pregnancy with inconclusive fetal viability, fetus 1: Secondary | ICD-10-CM

## 2022-03-24 DIAGNOSIS — Z3A01 Less than 8 weeks gestation of pregnancy: Secondary | ICD-10-CM | POA: Insufficient documentation

## 2022-03-24 DIAGNOSIS — O0289 Other abnormal products of conception: Secondary | ICD-10-CM | POA: Diagnosis not present

## 2022-03-24 DIAGNOSIS — O039 Complete or unspecified spontaneous abortion without complication: Secondary | ICD-10-CM | POA: Insufficient documentation

## 2022-03-24 LAB — URINALYSIS, ROUTINE W REFLEX MICROSCOPIC
Bilirubin Urine: NEGATIVE
Glucose, UA: NEGATIVE mg/dL
Hgb urine dipstick: NEGATIVE
Ketones, ur: NEGATIVE mg/dL
Nitrite: NEGATIVE
Protein, ur: NEGATIVE mg/dL
Specific Gravity, Urine: 1.02 (ref 1.005–1.030)
pH: 6 (ref 5.0–8.0)

## 2022-03-24 LAB — COMPREHENSIVE METABOLIC PANEL
ALT: 31 U/L (ref 0–44)
AST: 21 U/L (ref 15–41)
Albumin: 3.8 g/dL (ref 3.5–5.0)
Alkaline Phosphatase: 33 U/L — ABNORMAL LOW (ref 38–126)
Anion gap: 6 (ref 5–15)
BUN: 7 mg/dL (ref 6–20)
CO2: 24 mmol/L (ref 22–32)
Calcium: 9.5 mg/dL (ref 8.9–10.3)
Chloride: 108 mmol/L (ref 98–111)
Creatinine, Ser: 0.78 mg/dL (ref 0.44–1.00)
GFR, Estimated: >60 mL/min (ref >60)
Glucose, Bld: 91 mg/dL (ref 70–99)
Potassium: 3.8 mmol/L (ref 3.5–5.1)
Sodium: 138 mmol/L (ref 135–145)
Total Bilirubin: 1.3 mg/dL — ABNORMAL HIGH (ref 0.3–1.2)
Total Protein: 7 g/dL (ref 6.5–8.1)

## 2022-03-24 LAB — CBC
HCT: 37.3 % (ref 36.0–46.0)
Hemoglobin: 12.1 g/dL (ref 12.0–15.0)
MCH: 26.9 pg (ref 26.0–34.0)
MCHC: 32.4 g/dL (ref 30.0–36.0)
MCV: 82.9 fL (ref 80.0–100.0)
Platelets: 219 10*3/uL (ref 150–400)
RBC: 4.5 MIL/uL (ref 3.87–5.11)
RDW: 14 % (ref 11.5–15.5)
WBC: 3.1 10*3/uL — ABNORMAL LOW (ref 4.0–10.5)
nRBC: 0 % (ref 0.0–0.2)

## 2022-03-24 LAB — HCG, QUANTITATIVE, PREGNANCY: hCG, Beta Chain, Quant, S: 22905 m[IU]/mL — ABNORMAL HIGH (ref <5)

## 2022-03-24 LAB — POCT PREGNANCY, URINE: Preg Test, Ur: POSITIVE — AB

## 2022-03-24 MED ORDER — HYDROXYZINE HCL 25 MG PO TABS
25.0000 mg | ORAL_TABLET | Freq: Once | ORAL | Status: DC
  Filled 2022-03-24: qty 1

## 2022-03-24 NOTE — MAU Provider Note (Signed)
History     [id number]  Arrival date and time: 03/24/22 0830    Chief Complaint  Patient presents with   Miscarriage     HPI Christina Jennings is a 31 y.o. at Unknown by LMP, who presents for miscarriage concern.   Patient reports [facility] and told that there was a pregnancy with no heart beat She returned for a follow up scan two weeks later and was told again there was no heartbeat and this was not a viable pregnancy She returned for a third time one week later and again was given the same result and advised to seek medical attention if she did not pass the pregnancy  Today reports she has not had any abdominal pain or bleeding She is worried she may have issues related to not yet passing the pregnancy   --/--/A POS Performed at [facility], [place] [address], [place], [place] [address]  [phone])  OB History     Gravida  5   Para  2   Term  2   Preterm  0   AB  2   Living  2      SAB  2   IAB  0   Ectopic  0   Multiple  0   Live Births  2           Past Medical History:  Diagnosis Date   Arrest of dilation, delivered, current hospitalization 01/21/2018   Chlamydia 2010   Chorioamnionitis 01/21/2018   Infection    UTI   Missed abortion 12/29/2016   Polyhydramnios in third trimester 01/17/2018   S/P dilatation and curettage 12/29/2016    Past Surgical History:  Procedure Laterality Date   CESAREAN SECTION N/A 01/19/2018   Procedure: CESAREAN SECTION;  Surgeon: Sloan Leiter, MD;  Location: [facility];  Service: Obstetrics;  Laterality: N/A;   DILATION AND CURETTAGE OF UTERUS     DILATION AND EVACUATION N/A 12/29/2016   Procedure: DILATATION AND EVACUATION;  Surgeon: Chancy Milroy, MD;  Location: [facility];  Service: Gynecology;  Laterality: N/A;   WISDOM TOOTH EXTRACTION  2016    Family History  Problem Relation Age of Onset   Heart disease Mother    Heart disease Father    Asthma Son     Diabetes Maternal Aunt     Social History   Socioeconomic History   Marital status: Single    Spouse name: Not on file   Number of children: Not on file   Years of education: Not on file   Highest education level: Not on file  Occupational History   Not on file  Tobacco Use   Smoking status: Never   Smokeless tobacco: Never  Vaping Use   Vaping Use: Never used  Substance and Sexual Activity   Alcohol use: No   Drug use: No   Sexual activity: Yes    Birth control/protection: None  Other Topics Concern   Not on file  Social History Narrative   Not on file   Social Determinants of Health   Financial Resource Strain: Not on file  Food Insecurity: Not on file  Transportation Needs: Not on file  Physical Activity: Not on file  Stress: Not on file  Social Connections: Not on file  Intimate Partner Violence: Not on file    No Known Allergies  No current facility-administered medications on file prior to encounter.   Current Outpatient  Medications on File Prior to Encounter  Medication Sig Dispense Refill   Elastic Bandages & Supports (COMFORT FIT MATERNITY SUPP MED) MISC One maternity belly support band (Patient not taking: Reported on 12/18/2017) 1 each 0   ferrous sulfate (FERROUSUL) 325 (65 FE) MG tablet Take 1 tablet (325 mg total) by mouth 2 (two) times daily with a meal. (Patient not taking: Reported on 02/28/2018) 60 tablet 1   ibuprofen (ADVIL,MOTRIN) 600 MG tablet Take 1 tablet (600 mg total) by mouth every 6 (six) hours as needed for mild pain. 30 tablet 0   oxyCODONE (OXY IR/ROXICODONE) 5 MG immediate release tablet Take 1 tablet (5 mg total) by mouth every 6 (six) hours as needed (breakthrough pain not relieved by ibuprofen and tylenol). (Patient not taking: Reported on 02/28/2018) 20 tablet 0   Prenatal Vit-Fe Fumarate-FA (PRENATAL MULTIVITAMIN) TABS tablet Take 1 tablet by mouth daily at 12 noon. (Patient not taking: Reported on 03/24/2022)     ranitidine (ZANTAC)  150 MG tablet Take 1 tablet (150 mg total) by mouth at bedtime. (Patient not taking: Reported on 01/02/2018) 30 tablet 1     ROS Pertinent positives and negative per HPI, all others reviewed and negative  Physical Exam   BP 122/62   Pulse (!) 56   Temp 98.1 F (36.7 C)   Resp 18   Ht 5' (1.524 m)   Wt 71.7 kg   BMI 30.86 kg/m   Patient Vitals for the past 24 hrs:  BP Temp Pulse Resp Height Weight  03/24/22 0853 122/62 98.1 F (36.7 C) (!) 56 18 5' (1.524 m) 71.7 kg    Physical Exam Vitals reviewed.  Constitutional:      General: She is not in acute distress.    Appearance: She is well-developed. She is not diaphoretic.  Eyes:     General: No scleral icterus. Pulmonary:     Effort: Pulmonary effort is normal. No respiratory distress.  Skin:    General: Skin is warm and dry.  Neurological:     Mental Status: She is alert.     Coordination: Coordination normal.      Cervical Exam    Bedside Ultrasound Not done  My interpretation: n/a  FHT N/a  Labs Results for orders placed or performed during the hospital encounter of 03/24/22 (from the past 24 hour(s))  Urinalysis, Routine w reflex microscopic     Status: Abnormal   Collection Time: 03/24/22  8:40 AM  Result Value Ref Range   Color, Urine YELLOW YELLOW   APPearance HAZY (A) CLEAR   Specific Gravity, Urine 1.020 1.005 - 1.030   pH 6.0 5.0 - 8.0   Glucose, UA NEGATIVE NEGATIVE mg/dL   Hgb urine dipstick NEGATIVE NEGATIVE   Bilirubin Urine NEGATIVE NEGATIVE   Ketones, ur NEGATIVE NEGATIVE mg/dL   Protein, ur NEGATIVE NEGATIVE mg/dL   Nitrite NEGATIVE NEGATIVE   Leukocytes,Ua SMALL (A) NEGATIVE   RBC / HPF 0-5 0 - 5 RBC/hpf   WBC, UA 0-5 0 - 5 WBC/hpf   Bacteria, UA FEW (A) NONE SEEN   Squamous Epithelial / LPF 6-10 0 - 5   Mucus PRESENT    Non Squamous Epithelial 0-5 (A) NONE SEEN  Pregnancy, urine POC     Status: Abnormal   Collection Time: 03/24/22  8:46 AM  Result Value Ref Range   Preg  Test, Ur POSITIVE (A) NEGATIVE  CBC     Status: Abnormal   Collection Time: 03/24/22  9:12 AM  Result Value Ref Range   WBC 3.1 (L) 4.0 - 10.5 K/uL   RBC 4.50 3.87 - 5.11 MIL/uL   Hemoglobin 12.1 12.0 - 15.0 g/dL   HCT 01.0 27.2 - 53.6 %   MCV 82.9 80.0 - 100.0 fL   MCH 26.9 26.0 - 34.0 pg   MCHC 32.4 30.0 - 36.0 g/dL   RDW 64.4 03.4 - 74.2 %   Platelets 219 150 - 400 K/uL   nRBC 0.0 0.0 - 0.2 %  Comprehensive metabolic panel     Status: Abnormal   Collection Time: 03/24/22  9:12 AM  Result Value Ref Range   Sodium 138 135 - 145 mmol/L   Potassium 3.8 3.5 - 5.1 mmol/L   Chloride 108 98 - 111 mmol/L   CO2 24 22 - 32 mmol/L   Glucose, Bld 91 70 - 99 mg/dL   BUN 7 6 - 20 mg/dL   Creatinine, Ser 5.95 0.44 - 1.00 mg/dL   Calcium 9.5 8.9 - 63.8 mg/dL   Total Protein 7.0 6.5 - 8.1 g/dL   Albumin 3.8 3.5 - 5.0 g/dL   AST 21 15 - 41 U/L   ALT 31 0 - 44 U/L   Alkaline Phosphatase 33 (L) 38 - 126 U/L   Total Bilirubin 1.3 (H) 0.3 - 1.2 mg/dL   GFR, Estimated >75 >64 mL/min   Anion gap 6 5 - 15  ABO/Rh     Status: None   Collection Time: 03/24/22  9:12 AM  Result Value Ref Range   ABO/RH(D)      A POS Performed at [facility], [address] [place], [place] [address]   hCG, quantitative, pregnancy     Status: Abnormal   Collection Time: 03/24/22  9:14 AM  Result Value Ref Range   hCG, Beta Chain, Quant, S 22,905 (H) <5 mIU/mL    Imaging US OB Comp Less 14 Wks  Result Date: 03/24/2022 CLINICAL DATA:  Pregnancy of unknown location EXAM: OBSTETRIC <14 WK ULTRASOUND TECHNIQUE: Transabdominal ultrasound was performed for evaluation of the gestation as well as the maternal uterus and adnexal regions. COMPARISON:  None Available. FINDINGS: Intrauterine gestational sac: Single Yolk sac:  Seen Embryo:  Seen Cardiac Activity: Not seen CRL:   3 mm   5 w 6 d                  [place] EDC: 11/18/2022 Subchorionic hemorrhage:  None visualized. Maternal uterus/adnexae: Unremarkable.  IMPRESSION: There is a gestational sac containing yolk sac and fetal pole within the fundus of the uterus. There is no demonstrable fetal cardiac activity. Differential diagnostic possibilities would include very early normal IUP or failed gestation with incomplete abortion. Short-term follow sonogram in 1-2 weeks may be considered. There are no adnexal masses.  There is no free fluid in pelvis. Electronically Signed   By: Ernie Avena M.D.   On: 03/24/2022 09:48    MAU Course  Procedures Lab Orders         Urinalysis, Routine w reflex microscopic Urine, Clean Catch         CBC         Comprehensive metabolic panel         hCG, quantitative, pregnancy         Pregnancy, urine POC    Meds ordered this encounter  Medications   hydrOXYzine (ATARAX) tablet 25 mg   Imaging Orders         US OB Comp  Less 14 Wks     MDM moderate  Assessment and Plan  #Suspected miscarriage #[redacted] weeks gestation of pregnancy US findings highly suspicious for nonviable pregnancy with no fetal cardiac activity seen and IUGS with irregular shape. That being said findings are not definitive and I do not have access to prior image results that would provide definitive proof. Discussed this with patient and recommended we do the following: she should try to obtain and bring prior US records to our office, and in the meantime I will schedule a follow up viability scan in our office. If we are able to receive prior records I can call her and discuss a management plan.  Reassured patient that miscarriage is common with ~1/4 of women experiencing it in their lifetime. Reassured patient that there is nothing she did or did not do to cause this. Reviewed most common reason is presumed to be genetic abnormalities that allow a pregnancy to start but not continue past an early stage, but realistically we do not know the cause in most cases. Reviewed that studies show no definite difference between attempting another pregnancy  sooner vs waiting, though some studies do show better live birth outcomes with trying sooner. Reviewed options of expectant, medical, or surgical management. Management to be determined once we receive prior records or we perform follow up scan in our office. Blood type --/--/A POS Performed at [facility], [address], [place], [place] [address]  [phone]), rhogam was not indicated.   Dispo: discharged to home in stable condition.    Venora Maples, MD/MPH 03/24/22 10:57 AM  Allergies as of 03/24/2022   No Known Allergies      Medication List     STOP taking these medications    Comfort Fit Maternity Supp Med Misc   ferrous sulfate 325 (65 FE) MG tablet Commonly known as: FerrouSul   oxyCODONE 5 MG immediate release tablet Commonly known as: Oxy IR/ROXICODONE   prenatal multivitamin Tabs tablet   ranitidine 150 MG tablet Commonly known as: ZANTAC       TAKE these medications    ibuprofen 600 MG tablet Commonly known as: ADVIL Take 1 tablet (600 mg total) by mouth every 6 (six) hours as needed for mild pain.

## 2022-03-24 NOTE — Discharge Instructions (Addendum)
You were seen in the MAU for a possible miscarriage. We did an ultrasound which showed an intrauterine pregnancy measuring about 5 weeks with no heartbeat, which is similar to what the [facility] told you. Unfortunately at this time our ultrasound does not definitively show a nonviable pregnancy.   If we are able to see records from your prior ultrasounds however, this would confirm the miscarriage. If you are able to contact the clinic, please ask them to fax the records to:  [facility] [place], [place] Fax: [phone]  If they are not able to fax the results please pick them up and bring them to our office. When you drop them off let them know to send the scan results to Dr. Dione Plover.  If I am able to see the results from your prior ultrasound results I will call you and we can discuss further management of your pregnancy. In the mean time I will schedule you for a follow up ultrasound at our office in about two weeks, just in case we are not able to obtain the records.   You may also spontaneously pass the pregnancy during that time. If you do, expect to have cramping and bleeding. If you have heavy vaginal bleeding where you soak more than a pad an hour, progressively worsening and severe abdominal pain, or a fever, return to the MAU immediately.

## 2022-03-24 NOTE — MAU Note (Signed)
.  Christina Jennings is a 31 y.o. at Unknown here in MAU reporting: she had a positive HPT in August. Went to pregnancy care center in September and was told she had a a 6 week 6 days pregnancy with a Heartbeat. She was seen 2 weks later and was told there was not heart beat. Followed up again and they still did not see a heartbeat and no growth form the fetus. Pt shtill had not hqad any cramping or bleedign and is worried that something may happen to her if she still has the pregnancy inside of her.  LMP: 01/05/2022 Onset of complaint: 4 weeks Pain score: 0 There were no vitals filed for this visit.   FHT:0 Lab orders placed from triage:   U/A UPT

## 2022-03-25 LAB — ABO/RH: ABO/RH(D): A POS

## 2022-04-05 ENCOUNTER — Ambulatory Visit (INDEPENDENT_AMBULATORY_CARE_PROVIDER_SITE_OTHER): Payer: Medicaid Other | Admitting: Obstetrics and Gynecology

## 2022-04-05 ENCOUNTER — Encounter: Payer: Self-pay | Admitting: Obstetrics and Gynecology

## 2022-04-05 ENCOUNTER — Ambulatory Visit (INDEPENDENT_AMBULATORY_CARE_PROVIDER_SITE_OTHER): Payer: Medicaid Other

## 2022-04-05 VITALS — BP 108/77 | HR 68 | Ht 60.0 in

## 2022-04-05 DIAGNOSIS — O021 Missed abortion: Secondary | ICD-10-CM

## 2022-04-05 DIAGNOSIS — O3680X1 Pregnancy with inconclusive fetal viability, fetus 1: Secondary | ICD-10-CM | POA: Diagnosis not present

## 2022-04-05 LAB — BETA HCG QUANT (REF LAB): hCG Quant: 5113 m[IU]/mL

## 2022-04-05 MED ORDER — IBUPROFEN 600 MG PO TABS: 600.0000 mg | ORAL_TABLET | Freq: Four times a day (QID) | ORAL | 1 refills | Status: DC | PRN

## 2022-04-05 MED ORDER — PROMETHAZINE HCL 12.5 MG PO TABS: 25.0000 mg | ORAL_TABLET | ORAL | 0 refills | Status: DC | PRN

## 2022-04-05 MED ORDER — MISOPROSTOL 200 MCG PO TABS: ORAL_TABLET | ORAL | 0 refills | Status: DC

## 2022-04-05 MED ORDER — OXYCODONE-ACETAMINOPHEN 5-325 MG PO TABS: 1.0000 | ORAL_TABLET | Freq: Four times a day (QID) | ORAL | 0 refills | Status: DC | PRN

## 2022-04-05 NOTE — Progress Notes (Signed)
   CC: missed abortion Subjective:    Patient ID: Christina Jennings, female    DOB: 1991-04-03, 31 y.o.   MRN: [id number]  HPI 31 yo G5P2 seen as add on for ultrasound documented SAB.  Pt has 6 week fetal pole with no fetal heart motion and abnormal gestational sac.  Findings were relayed to the patient  and she was given the options of expectant management, medical management (cytotec) and surgical management ( d and C) with genetic testing Elliot Gault).  Pt has had 2 D and C's before but would prefer cytotec as she has no one local for post op transportation and general preference for medication.  Risk and benefits of cyctotec given including increased pain with bleeding, incomplete removal of pregnancy, inability to provide genetic testing and need for future surgical management. Pt still desires cytotec treatment.   Review of Systems     Objective:   Physical Exam Vitals:   04/05/22 0918  BP: 108/77  Pulse: 68         Assessment & Plan:   1. Pregnancy with uncertain fetal viability, fetus 1 of multiple gestation Baseline bhcg - Beta hCG quant (ref lab)  2. Missed abortion Cytotec treatment as above, follow up in 1 week with bhcg check +/- repeat ultrasound. - misoprostol (CYTOTEC) 200 MCG tablet; Place 4 tabs in vagina x 1  Dispense: 4 tablet; Refill: 0 - ibuprofen (ADVIL) 600 MG tablet; Take 1 tablet (600 mg total) by mouth every 6 (six) hours as needed for mild pain.  Dispense: 30 tablet; Refill: 1 - oxyCODONE-acetaminophen (PERCOCET/ROXICET) 5-325 MG tablet; Take 1 tablet by mouth every 6 (six) hours as needed for severe pain.  Dispense: 4 tablet; Refill: 0 - promethazine (PHENERGAN) 12.5 MG tablet; Take 2 tablets (25 mg total) by mouth every 4 (four) hours as needed for nausea or vomiting.  Dispense: 30 tablet; Refill: 0   I spent 20 minutes dedicated to the care of this patient including previsit review of records, face to face time with the patient discussing treatment options and  post visit testing.   Griffin Basil, MD Faculty Attending, [facility]            Early Intrauterine Pregnancy Failure  __x_  Documented intrauterine pregnancy failure less than or equal to [redacted] weeks gestation  __x_  No serious current illness  __x_  Baseline Hgb greater than or equal to 10g/dl  __x_  Patient has easily accessible transportation to the hospital  _x__  Clear preference  __x_  Practitioner/physician deems patient reliable  __x_  Counseling by practitioner or physician  __x_  Patient education by RN  ___  Consent form signed  ___  Rho-Gam given by RN if indicated  _x__  Medication dispensed   _x__   Cytotec 800 mcg  _x_   Intravaginally by patient at home         __   Intravaginally by RN in MAU        __   Rectally by patient at home        __   Rectally by RN in MAU  __x_  Ibuprofen 600 mg 1 tablet by mouth every 6 hours as needed #30  __x_  oxycodone/acetaminophen 5/325 mg by mouth every 4 to 6 hours as needed  __x_  Phenergan 12.5 mg by mouth every 4 hours as needed for nausea

## 2022-06-13 NOTE — L&D Delivery Note (Addendum)
OB/GYN Faculty Practice Delivery Note  Christina Jennings is a 32 y.o. [id number] s/p SVD of [redacted] weeks gestation IUFD. She was admitted for IUFD.   ROM: rupture date, rupture time, delivery date, or delivery time have not been documented with brown fluid GBS Status: Unknown   Maximum Maternal Temperature:  Temp (24hrs), Avg:98.9 F (37.2 C), Min:98.5 F (36.9 C), Max:99.6 F (37.6 C)    Labor Progress: Patient arrived at 0 cm dilation and was induced with Cytotec.   Delivery Date/Time: 01/03/2023 at 2057 Delivery: Called to room and patient had finished pushing and IUFD was laying in the bed between the patient's legs.  Fetus was delivered en caul and placenta came with the gestational sac.  Minimal amount of bleeding.  Fetus initially taken to table with placenta for evaluation.  Unable to draw cord blood.   Fundus firm with massage and Pitocin. Labia, perineum, vagina, and cervix inspected with no lacerations.   Placenta:  spontaneous, intact, 3 vessel cord  Complications: None Lacerations: None EBL: 125 Analgesia: None  Infant: IUFD  Derrel Nip, MD  OB Fellow  01/03/2023 9:16 PM

## 2022-09-23 ENCOUNTER — Encounter: Payer: Medicaid Other | Admitting: Radiology

## 2022-12-27 ENCOUNTER — Inpatient Hospital Stay (HOSPITAL_BASED_OUTPATIENT_CLINIC_OR_DEPARTMENT_OTHER): Payer: Medicaid Other

## 2022-12-27 ENCOUNTER — Inpatient Hospital Stay (HOSPITAL_COMMUNITY)
Admission: AD | Admit: 2022-12-27 | Discharge: 2022-12-27 | Disposition: A | Discharge status: Home / Self Care | Payer: Medicaid Other | Attending: Obstetrics and Gynecology | Admitting: Obstetrics and Gynecology

## 2022-12-27 ENCOUNTER — Encounter (HOSPITAL_COMMUNITY): Payer: Self-pay

## 2022-12-27 DIAGNOSIS — O26892 Other specified pregnancy related conditions, second trimester: Secondary | ICD-10-CM

## 2022-12-27 DIAGNOSIS — O9A212 Injury, poisoning and certain other consequences of external causes complicating pregnancy, second trimester: Secondary | ICD-10-CM | POA: Diagnosis not present

## 2022-12-27 DIAGNOSIS — O021 Missed abortion: Secondary | ICD-10-CM | POA: Diagnosis not present

## 2022-12-27 DIAGNOSIS — Z3A19 19 weeks gestation of pregnancy: Secondary | ICD-10-CM | POA: Diagnosis not present

## 2022-12-27 DIAGNOSIS — W1830XA Fall on same level, unspecified, initial encounter: Secondary | ICD-10-CM | POA: Diagnosis not present

## 2022-12-27 DIAGNOSIS — O368320 Maternal care for abnormalities of the fetal heart rate or rhythm, second trimester, not applicable or unspecified: Secondary | ICD-10-CM | POA: Diagnosis not present

## 2022-12-27 DIAGNOSIS — Z3A20 20 weeks gestation of pregnancy: Secondary | ICD-10-CM | POA: Diagnosis not present

## 2022-12-27 DIAGNOSIS — W19XXXA Unspecified fall, initial encounter: Secondary | ICD-10-CM | POA: Diagnosis not present

## 2022-12-27 NOTE — MAU Provider Note (Signed)
History     CSN: [id number]  Arrival date and time: 12/27/22 8657   Event Date/Time   First Provider Initiated Contact with Patient 12/27/22 0703      No chief complaint on file.  Christina Jennings , a  32 y.o. [id number] at [redacted]w[redacted]d presents to MAU after a fall. Patient states that this morning she got up to use the bathroom and fell on her side. She denies direct abdominal trauma, vaginal bleeding or pain. She states she just wanted to get checked out. She denies abnormal vaginal discharge and is unsure if she has started feeling movement yet.   - She reports she gets care at [facility] and recently had a anatomy scan.          OB History     Gravida  6   Para  2   Term  2   Preterm  0   AB  2   Living  2      SAB  2   IAB  0   Ectopic  0   Multiple  0   Live Births  2           Past Medical History:  Diagnosis Date   Arrest of dilation, delivered, current hospitalization 01/21/2018   Chlamydia 2010   Chorioamnionitis 01/21/2018   Infection    UTI   Missed abortion 12/29/2016   Polyhydramnios in third trimester 01/17/2018   S/P dilatation and curettage 12/29/2016    Past Surgical History:  Procedure Laterality Date   CESAREAN SECTION N/A 01/19/2018   Procedure: CESAREAN SECTION;  Surgeon: Conan Bowens, MD;  Location: [facility];  Service: Obstetrics;  Laterality: N/A;   DILATION AND CURETTAGE OF UTERUS     DILATION AND EVACUATION N/A 12/29/2016   Procedure: DILATATION AND EVACUATION;  Surgeon: Hermina Staggers, MD;  Location: [facility];  Service: Gynecology;  Laterality: N/A;   WISDOM TOOTH EXTRACTION  2016    Family History  Problem Relation Age of Onset   Heart disease Mother    Heart disease Father    Asthma Son    Diabetes Maternal Aunt     Social History   Tobacco Use   Smoking status: Never   Smokeless tobacco: Never  Vaping Use   Vaping status: Never Used  Substance Use Topics   Alcohol use: No   Drug use: No    Allergies: No Known  Allergies  Medications Prior to Admission  Medication Sig Dispense Refill Last Dose   ibuprofen (ADVIL) 600 MG tablet Take 1 tablet (600 mg total) by mouth every 6 (six) hours as needed for mild pain. 30 tablet 1    misoprostol (CYTOTEC) 200 MCG tablet Place 4 tabs in vagina x 1 4 tablet 0    oxyCODONE-acetaminophen (PERCOCET/ROXICET) 5-325 MG tablet Take 1 tablet by mouth every 6 (six) hours as needed for severe pain. 4 tablet 0    promethazine (PHENERGAN) 12.5 MG tablet Take 2 tablets (25 mg total) by mouth every 4 (four) hours as needed for nausea or vomiting. 30 tablet 0     Review of Systems  Constitutional:  Negative for chills, fatigue and fever.  Eyes:  Negative for pain and visual disturbance.  Respiratory:  Negative for apnea, shortness of breath and wheezing.   Cardiovascular:  Negative for chest pain and palpitations.  Gastrointestinal:  Negative for abdominal pain, constipation, diarrhea, nausea and vomiting.  Genitourinary:  Negative for difficulty urinating, dysuria, pelvic pain, vaginal bleeding,  vaginal discharge and vaginal pain.  Musculoskeletal:  Negative for back pain.  Neurological:  Negative for seizures, weakness and headaches.  Psychiatric/Behavioral:  Negative for suicidal ideas.    Physical Exam   Blood pressure 115/66, pulse (!) 108, temperature 98.3 F (36.8 C), temperature source Oral, resp. rate 17, height 5' (1.524 m), weight 75.9 kg, SpO2 100%, unknown if currently breastfeeding.  Physical Exam Vitals and nursing note reviewed.  Constitutional:      General: She is not in acute distress.    Appearance: Normal appearance.  HENT:     Head: Normocephalic.  Pulmonary:     Effort: Pulmonary effort is normal.  Abdominal:     Palpations: Abdomen is soft.  Musculoskeletal:     Cervical back: Normal range of motion.  Skin:    General: Skin is warm and dry.  Neurological:     Mental Status: She is alert and oriented to person, place, and time.   Psychiatric:        Mood and Affect: Mood normal.     MAU Course  Procedures Orders Placed This Encounter  Procedures   [place] MFM OB Limited    Standing Status:   Standing    Number of Occurrences:   1    Order Specific Question:   Reason for Exam (SYMPTOM  OR DIAGNOSIS REQUIRED)    Answer:   fall    MDM - RN unable to obtained heart tones in MAU  - Patient sent for viability [place].  - IUFD confirmed on [place].  - Results given in MAU. Patient reports that she "kinda figured" Patient desires to go home to process and proceed with a plan of care later.  - Call placed to Dr. Sallye Ober. Reviewed patient presentation and current clinical picture.  Discussed patient's desire to go home and MD agrees with plan.   Assessment and Plan   1. IUFD at less than 20 weeks of gestation   2. [redacted] weeks gestation of pregnancy   3. Fall, initial encounter    - Condolences given  - Patient notified that office will call her to set up an appointment sometime today.  - Pain and bleeding precautions reviewed and return precautions discussed.  - Patient discharged home in stable condition and may return to MAU as needed.   Claudette Head, MSN CNM  12/27/2022, 7:03 AM

## 2022-12-27 NOTE — MAU Note (Signed)
..  Christina Jennings is a 32 y.o. at [redacted]w[redacted]d here in MAU reporting: got up to go to the restroom and fell on her side, reports she did not hit her abdomen. Denies pain, vaginal bleeding, or leaking of fluid.   Pain score: 0/10 Vitals:   12/27/22 0650  BP: 115/66  Pulse: (!) 108  Resp: 17  Temp: 98.3 F (36.8 C)  SpO2: 100%     JXB:JYNWGN to doppler in triage, provider notified Lab orders placed from triage:  none

## 2023-01-03 ENCOUNTER — Other Ambulatory Visit: Payer: Self-pay

## 2023-01-03 ENCOUNTER — Encounter (HOSPITAL_COMMUNITY): Payer: Self-pay | Admitting: Obstetrics and Gynecology

## 2023-01-03 ENCOUNTER — Inpatient Hospital Stay (HOSPITAL_COMMUNITY)
Admission: AD | Admit: 2023-01-03 | Discharge: 2023-01-04 | DRG: 779 | Disposition: A | Discharge status: Home / Self Care | Payer: Medicaid Other | Attending: Obstetrics and Gynecology | Admitting: Obstetrics and Gynecology

## 2023-01-03 DIAGNOSIS — Z3A19 19 weeks gestation of pregnancy: Secondary | ICD-10-CM | POA: Diagnosis not present

## 2023-01-03 DIAGNOSIS — O021 Missed abortion: Secondary | ICD-10-CM | POA: Diagnosis present

## 2023-01-03 DIAGNOSIS — O364XX0 Maternal care for intrauterine death, not applicable or unspecified: Secondary | ICD-10-CM

## 2023-01-03 LAB — CBC
HCT: 33.7 % — ABNORMAL LOW (ref 36.0–46.0)
Hemoglobin: 10.9 g/dL — ABNORMAL LOW (ref 12.0–15.0)
MCH: 26.7 pg (ref 26.0–34.0)
MCHC: 32.3 g/dL (ref 30.0–36.0)
MCV: 82.4 fL (ref 80.0–100.0)
Platelets: 161 10*3/uL (ref 150–400)
RBC: 4.09 MIL/uL (ref 3.87–5.11)
RDW: 14.4 % (ref 11.5–15.5)
WBC: 8.2 10*3/uL (ref 4.0–10.5)
nRBC: 0 % (ref 0.0–0.2)

## 2023-01-03 LAB — DIC (DISSEMINATED INTRAVASCULAR COAGULATION)PANEL
D-Dimer, Quant: 13.3 ug/mL-FEU — ABNORMAL HIGH (ref 0.00–0.50)
Fibrinogen: 287 mg/dL (ref 210–475)
INR: 1.1 (ref 0.8–1.2)
Platelets: 158 10*3/uL (ref 150–400)
Prothrombin Time: 14.4 seconds (ref 11.4–15.2)
Smear Review: NONE SEEN
aPTT: 27 seconds (ref 24–36)

## 2023-01-03 LAB — TYPE AND SCREEN
ABO/RH(D): A POS
Antibody Screen: NEGATIVE

## 2023-01-03 MED ORDER — LACTATED RINGERS IV SOLN: INTRAVENOUS | Status: DC

## 2023-01-03 MED ORDER — SODIUM CHLORIDE 0.9 % IV SOLN: 25.0000 mg | Freq: Four times a day (QID) | INTRAVENOUS | Status: DC | PRN

## 2023-01-03 MED ORDER — MISOPROSTOL 200 MCG PO TABS: 200.0000 ug | ORAL_TABLET | ORAL | Status: DC | PRN

## 2023-01-03 MED ORDER — METHYLERGONOVINE MALEATE 0.2 MG PO TABS
0.2000 mg | ORAL_TABLET | Freq: Four times a day (QID) | ORAL | Status: DC
  Administered 2023-01-03 – 2023-01-04 (×2): 0.2 mg via ORAL
  Filled 2023-01-03 (×2): qty 1

## 2023-01-03 MED ORDER — FENTANYL CITRATE (PF) 100 MCG/2ML IJ SOLN
100.0000 ug | INTRAMUSCULAR | Status: DC | PRN
  Administered 2023-01-03 (×3): 100 ug via INTRAVENOUS
  Filled 2023-01-03 (×3): qty 2

## 2023-01-03 MED ORDER — MISOPROSTOL 200 MCG PO TABS: 400.0000 ug | ORAL_TABLET | Freq: Four times a day (QID) | ORAL | Status: DC

## 2023-01-03 MED ORDER — MISOPROSTOL 200 MCG PO TABS
ORAL_TABLET | ORAL | Status: AC
  Administered 2023-01-03: 400 ug via VAGINAL
  Filled 2023-01-03: qty 2

## 2023-01-03 MED ORDER — ACETAMINOPHEN 325 MG PO TABS: 650.0000 mg | ORAL_TABLET | Freq: Four times a day (QID) | ORAL | Status: DC | PRN

## 2023-01-03 MED ORDER — OXYTOCIN-SODIUM CHLORIDE 30-0.9 UT/500ML-% IV SOLN: 1.0000 m[IU]/min | INTRAVENOUS | Status: DC

## 2023-01-03 MED ORDER — MISOPROSTOL 200 MCG PO TABS
ORAL_TABLET | ORAL | Status: AC
  Administered 2023-01-03: 400 ug via VAGINAL
  Filled 2023-01-03: qty 1

## 2023-01-03 MED ORDER — OXYTOCIN-SODIUM CHLORIDE 30-0.9 UT/500ML-% IV SOLN
INTRAVENOUS | Status: AC
  Filled 2023-01-03: qty 500

## 2023-01-03 MED ORDER — PROMETHAZINE HCL 25 MG PO TABS: 25.0000 mg | ORAL_TABLET | Freq: Four times a day (QID) | ORAL | Status: DC | PRN

## 2023-01-03 NOTE — MAU Note (Addendum)
.  Christina Jennings is a 32 y.o. at 104w6d here in MAU reporting: She reports she was called last night by her OB and was told to come here today to be direct admitted to deliver her baby. Denies VB or LOF. Denies fever at home.  Seen in MAU on 7/16 and dx with IUFD at [redacted]w[redacted]d post fall.  This RN informed Dorathy Kinsman, CNM. This RN called Dr. Normand Sloop for further clarification. Dr. Normand Sloop stated patient to go to L&D. Dr. Normand Sloop then later called this RN and stated she is actually supposed to go to [facility]. [facility] Charge RN called to notify of direct admission status. [facility] Charge RN to call back. Dr. Normand Sloop, MD, notified of the update. MD to call [facility].  Pain score: Denies pain.  FHT: n/a Lab orders placed from triage: none

## 2023-01-03 NOTE — Progress Notes (Signed)
Patient ID: Christina Jennings, female   DOB: March 13, 1991, 32 y.o.   MRN: [id number] Pt had delivered the baby and placenta at 19 weeks 6 days BP (!) 111/57   Pulse (!) 102   Temp 100.3 F (37.9 C) (Oral)   Resp 16   Ht 5' (1.524 m)   Wt 75.9 kg   BMI 32.67 kg/m  Fundus is firm and mildly tender GU V/V moderate VB perineum intact Will send pt to [facility] specialty care Anora  collected.

## 2023-01-03 NOTE — H&P (Addendum)
Christina Jennings is a 32 y.o. female presenting for IOL for IUFD at 19 weeks. She had mild abdominal trauma from a fall on 7/15.  [place] on 7/16 demonstrated IUFD.  No sign of abruption.  She has no complaints OB History     Gravida  6   Para  2   Term  2   Preterm  0   AB  2   Living  2      SAB  2   IAB  0   Ectopic  0   Multiple  0   Live Births  2          Past Medical History:  Diagnosis Date   Arrest of dilation, delivered, current hospitalization 01/21/2018   Chlamydia 2010   Chorioamnionitis 01/21/2018   Infection    UTI   Missed abortion 12/29/2016   Polyhydramnios in third trimester 01/17/2018   S/P dilatation and curettage 12/29/2016   Past Surgical History:  Procedure Laterality Date   CESAREAN SECTION N/A 01/19/2018   Procedure: CESAREAN SECTION;  Surgeon: Conan Bowens, MD;  Location: [facility] BIRTHING SUITES;  Service: Obstetrics;  Laterality: N/A;   DILATION AND CURETTAGE OF UTERUS     DILATION AND EVACUATION N/A 12/29/2016   Procedure: DILATATION AND EVACUATION;  Surgeon: Hermina Staggers, MD;  Location: [facility] ORS;  Service: Gynecology;  Laterality: N/A;   WISDOM TOOTH EXTRACTION  2016   Family History: family history includes Asthma in her son; Diabetes in her maternal aunt; Heart disease in her father and mother. Social History:  reports that she has never smoked. She has never used smokeless tobacco. She reports that she does not drink alcohol and does not use drugs.     Maternal Diabetes: No Genetic Screening: Normal Maternal Ultrasounds/Referrals: Other: Fetal Ultrasounds or other Referrals:  None Maternal Substance Abuse:  No Significant Maternal Medications:  None Significant Maternal Lab Results:  None Number of Prenatal Visits:greater than 3 verified prenatal visits Other Comments:  None  Review of Systems History Dilation: Closed Effacement (%): Thick Station: Ballotable Exam by:: Dr. Normand Sloop Blood pressure 102/71, pulse 65, height 5' (1.524  m), weight 75.9 kg, unknown if currently breastfeeding. 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    Prenatal labs: ABO, Rh: --/--/A POS (07/23 1118) Antibody: NEG (07/23 1118) Rubella:   RPR:    HBsAg:    HIV:    GBS:     Assessment/Plan: IUFD at 19 weeks TORCh titers anora lab and APS testing done Cytotec induction.  She does have H/O of CS.  Discussed minimal risk of rupture at this gestational age US done on 7/16 by MFM.  No fetal heart seen.  I confirmed with bedside US today Fentanyl prn Regular diet   Christina Jennings 01/03/2023, 1:07 PM

## 2023-01-03 NOTE — Progress Notes (Addendum)
Chaplain responded to spiritual care consult in the setting of IUFD. Chaplain introduced spiritual care and offered support as Aamori prepares for the delivery of her son. Chaplain asked open ended questions to facilitate emotional expression and story telling. Smriti shared that this is her fourth loss, but the first time she has had to deliver the baby. She learned that her son no longer had a heart beat a week ago. She went home and continued to pray and hope that the news would not be true. She was called to the hospital by her physician who was concerned about risk of infection and Kellen remained hopeful that another ultrasound would indicate her baby was still alive. She did not realize that she would need to be induced as previous losses were managed medically or at home. She has chosen to labor and deliver alone realizing that she has a tendency to suppress her own emotions in an effort to care for others and she is aware that she needs to remain emotionally present throughout this process. Chaplain provided grief education and support, utilizing reflective listening to normalize emotions, and identify challenges, strengths, and coping strategies.   Pt expressed gratitude for the visit and welcomes continued support from spiritual care. She is aware that chaplain services remain available around the clock. Chaplain will follow up on Wednesday morning if not otherwise consulted.  Please page as further needs arise.  Maryanna Shape. Carley Hammed, M.Div. [facility] Chaplain Pager [phone] Office [phone]

## 2023-01-04 ENCOUNTER — Encounter (HOSPITAL_COMMUNITY): Payer: Self-pay | Admitting: Obstetrics and Gynecology

## 2023-01-04 LAB — CBC
HCT: 31.5 % — ABNORMAL LOW (ref 36.0–46.0)
Hemoglobin: 10.5 g/dL — ABNORMAL LOW (ref 12.0–15.0)
MCH: 27.6 pg (ref 26.0–34.0)
MCHC: 33.3 g/dL (ref 30.0–36.0)
MCV: 82.9 fL (ref 80.0–100.0)
Platelets: 120 10*3/uL — ABNORMAL LOW (ref 150–400)
RBC: 3.8 MIL/uL — ABNORMAL LOW (ref 3.87–5.11)
RDW: 14.4 % (ref 11.5–15.5)
WBC: 14.4 10*3/uL — ABNORMAL HIGH (ref 4.0–10.5)
nRBC: 0 % (ref 0.0–0.2)

## 2023-01-04 LAB — INFECT DISEASE AB IGM REFLEX 1

## 2023-01-04 LAB — HSV(HERPES SIMPLEX VRS) I + II AB-IGG
HSV 1 Glycoprotein G Ab, IgG: 13.9 index — ABNORMAL HIGH (ref 0.00–0.90)
HSV 2 Glycoprotein G Ab, IgG: 1.92 index — ABNORMAL HIGH (ref 0.00–0.90)

## 2023-01-04 LAB — CMV ANTIBODY, IGG (EIA): CMV Ab - IgG: <0.6 U/mL (ref 0.00–0.59)

## 2023-01-04 LAB — TOXOPLASMA GONDII ANTIBODY, IGM: Toxoplasma Antibody- IgM: <3 AU/mL (ref 0.0–7.9)

## 2023-01-04 MED ORDER — ACETAMINOPHEN 325 MG PO TABS: 650.0000 mg | ORAL_TABLET | ORAL | Status: DC | PRN

## 2023-01-04 MED ORDER — ONDANSETRON HCL 4 MG/2ML IJ SOLN: 4.0000 mg | Freq: Four times a day (QID) | INTRAMUSCULAR | Status: DC | PRN

## 2023-01-04 MED ORDER — SIMETHICONE 80 MG PO CHEW: 80.0000 mg | CHEWABLE_TABLET | Freq: Four times a day (QID) | ORAL | Status: DC | PRN

## 2023-01-04 MED ORDER — ONDANSETRON HCL 4 MG PO TABS: 4.0000 mg | ORAL_TABLET | Freq: Four times a day (QID) | ORAL | Status: DC | PRN

## 2023-01-04 MED ORDER — IBUPROFEN 600 MG PO TABS: 600.0000 mg | ORAL_TABLET | Freq: Four times a day (QID) | ORAL | Status: DC | PRN

## 2023-01-04 MED ORDER — LACTATED RINGERS IV SOLN: INTRAVENOUS | Status: DC

## 2023-01-04 NOTE — Discharge Instructions (Addendum)
Rukia, 1. Do not do any heavy lifting, i.e nothing heavier than 30 lbs for the next 6 weeks.  2.  Do not use tampons or douche or take baths, do not have any sexual intercourse or anything inside the vagina for the next 6 weeks.  3. Take your pain medication as needed for pain, let us know if the pain is not well controlled despite pain medication use.  4.  If you get a fever while at home, do check your temperature and if it is equal to or greater than 100.4 please call the office.   5. Some vaginal bleeding is expected and normal after your delivery. Please let us know if if it excessive where you saturate 1 pad in less than 2 hours or so.  6. Please let us know if with depression or anxiety symptoms.    [facility] [phone].

## 2023-01-04 NOTE — Plan of Care (Signed)
  Problem: Education: Goal: Knowledge of General Education information will improve Description: Including pain rating scale, medication(s)/side effects and non-pharmacologic comfort measures Outcome: Adequate for Discharge   Problem: Clinical Measurements: Goal: Ability to maintain clinical measurements within normal limits will improve Outcome: Adequate for Discharge Goal: Will remain free from infection Outcome: Adequate for Discharge Goal: Diagnostic test results will improve Outcome: Adequate for Discharge Goal: Respiratory complications will improve Outcome: Adequate for Discharge   Problem: Activity: Goal: Risk for activity intolerance will decrease Outcome: Adequate for Discharge   Problem: Nutrition: Goal: Adequate nutrition will be maintained Outcome: Adequate for Discharge   Problem: Coping: Goal: Level of anxiety will decrease Outcome: Adequate for Discharge   Problem: Elimination: Goal: Will not experience complications related to bowel motility Outcome: Adequate for Discharge Goal: Will not experience complications related to urinary retention Outcome: Adequate for Discharge   Problem: Pain Managment: Goal: General experience of comfort will improve Outcome: Adequate for Discharge   Problem: Safety: Goal: Ability to remain free from injury will improve Outcome: Adequate for Discharge   Problem: Skin Integrity: Goal: Risk for impaired skin integrity will decrease Outcome: Adequate for Discharge   Problem: Education: Goal: Knowledge of disease or condition will improve Outcome: Adequate for Discharge Goal: Knowledge of the prescribed therapeutic regimen will improve Outcome: Adequate for Discharge   Problem: Coping: Goal: Ability to identify and utilize appropriate coping strategies will improve Outcome: Adequate for Discharge Goal: Ability to identify and utilize available support systems will improve Outcome: Adequate for Discharge Goal: Will  verbalize feelings Outcome: Adequate for Discharge Goal: Decrease level of anxiety will Outcome: Adequate for Discharge   Problem: Clinical Measurements: Goal: Will show no signs and symptoms of excessive bleeding Outcome: Adequate for Discharge   Problem: Education: Goal: Knowledge of disease or condition will improve Outcome: Adequate for Discharge Goal: Knowledge of the prescribed therapeutic regimen will improve Outcome: Adequate for Discharge   Problem: Coping: Goal: Ability to identify and utilize appropriate coping strategies will improve Outcome: Adequate for Discharge Goal: Ability to identify and utilize available support systems will improve Outcome: Adequate for Discharge Goal: Will verbalize feelings Outcome: Adequate for Discharge Goal: Decrease level of anxiety will Outcome: Adequate for Discharge   Problem: Clinical Measurements: Goal: Will show no signs and symptoms of excessive bleeding Outcome: Adequate for Discharge   Problem: Education: Goal: Knowledge of the prescribed therapeutic regimen will improve Outcome: Adequate for Discharge Goal: Understanding of sexual limitations or changes related to disease process or condition will improve Outcome: Adequate for Discharge Goal: Individualized Educational Video(s) Outcome: Adequate for Discharge   Problem: Self-Concept: Goal: Communication of feelings regarding changes in body function or appearance will improve Outcome: Adequate for Discharge   Problem: Skin Integrity: Goal: Demonstration of wound healing without infection will improve Outcome: Adequate for Discharge

## 2023-01-04 NOTE — Progress Notes (Signed)
Chaplain spent several hours with Christina Jennings throughout the day as she continues to grieve her son, Christina Jennings. Chaplain asked open ended questions to facilitate emotional expression and story telling. Christina Jennings tearfully shared her birth story and her experience meeting and holding Christina Jennings throughout the night. She reluctantly shared the news of her baby's death with her niece who was able to communicate it with her family in the [place] part of the state (who are all gathered celebrating the birth of her brother's child on the same day.) Christina Jennings continues to chose to remain alone throughout this process in order to best care for herself in her unique family dynamic. Chaplain utilized reflective listening to identify challenges and sources of strength and coping skills. Chaplain supported the grief process through grief education and self care teaching in addition to supportive presence.   Chaplain assisted pt in communicating with the funeral home to arrange for cremation. She plans to go to [facility] after discharge this afternoon. She is aware that continued support remains available after discharge and is aware of how to reach the chaplain.  Please page as further needs arise.  Maryanna Shape. Carley Hammed, M.Div. [facility] Chaplain Pager [phone] Office [phone]       01/04/23 1342  Spiritual Encounters  Type of Visit Follow up  Care provided to: Patient  Referral source Chaplain assessment  Reason for visit Grief/loss  Spiritual Framework  Presenting Themes Values and beliefs;Significant life change;Caregiving needs;Coping tools;Impactful experiences and emotions;Courage hope and growth  Community/Connection Friend(s);Family;Significant other  Patient Stress Factors Loss  Interventions  Spiritual Care Interventions Made Compassionate presence;Reflective listening;Normalization of emotions;Self-care teaching;Supported grief process;Provided grief education;Bereavement/grief support  Intervention  Outcomes  Outcomes Reduced isolation;Awareness of support;Connection to spiritual care;Awareness around self/spiritual resourses;Connection to values and goals of care;Autonomy/agency

## 2023-01-04 NOTE — Discharge Summary (Addendum)
Postpartum Discharge Summary  Patient Name: Christina Jennings DOB: December 31, 1990 MRN: [id number]  Date of admission: 01/03/2023 Delivery date:01/03/2023 Delivering provider: Celedonio Savage Date of discharge: 01/04/2023  Admitting diagnosis: IUFD at less than 20 weeks of gestation [O02.1] Intrauterine pregnancy: [redacted]w[redacted]d     Secondary diagnosis:  Principal Problem:   IUFD at less than 20 weeks of gestation    Discharge diagnosis:  Fetal demise.                                                Post partum procedures: None Augmentation: Cytotec Complications: None  Hospital course: Induction of Labor With Vaginal Delivery   32 y.o. yo [id number] at [redacted]w[redacted]d was admitted to the hospital 01/03/2023 for induction of labor.  Indication for induction:  Fetal demise at 19 weeks 6 days EGA .  Patient had an uncomplicated labor and postpartum course. Delivery Method:VBAC, Spontaneous Episiotomy: None Lacerations:  None Details of delivery can be found in separate delivery note.  Patient had chaplain and social work consultations.  She declined autopsy of the fetus.  ANORA, placenta pathology, TORCH titers, antiphospholipid labs and DIC labs were collected.  Patient is discharged home 01/04/23.  Newborn Data: Birth date:01/03/2023 Birth time:8:57 PM Gender:Female Living status:Fetal Demise Apgars: ,  Weight:349 g  Physical exam  Vitals:   01/04/23 0007 01/04/23 0020 01/04/23 0136 01/04/23 0753  BP: (!) 99/52 117/63 (!) 121/58 102/63  Pulse: 67 91 82 66  Resp:  16 18 16   Temp:  99 F (37.2 C) 98.6 F (37 C) 98.1 F (36.7 C)  TempSrc:  Oral Oral Oral  SpO2:  100% 99% 97%  Weight:      Height:       General: alert, teary.  Lochia: appropriate Uterine Fundus: firm Incision: N/A DVT Evaluation: No evidence of DVT seen on physical exam. No significant calf/ankle edema. Labs: Lab Results  Component Value Date   WBC 14.4 (H) 01/04/2023   HGB 10.5 (L) 01/04/2023   HCT 31.5 (L) 01/04/2023   MCV  82.9 01/04/2023   PLT 120 (L) 01/04/2023      Latest Ref Rng & Units 03/24/2022    9:12 AM  CMP  Glucose 70 - 99 mg/dL 91   BUN 6 - 20 mg/dL 7   Creatinine 6.21 - 3.08 mg/dL 6.57   Sodium 846 - 962 mmol/L 138   Potassium 3.5 - 5.1 mmol/L 3.8   Chloride 98 - 111 mmol/L 108   CO2 22 - 32 mmol/L 24   Calcium 8.9 - 10.3 mg/dL 9.5   Total Protein 6.5 - 8.1 g/dL 7.0   Total Bilirubin 0.3 - 1.2 mg/dL 1.3   Alkaline Phos 38 - 126 U/L 33   AST 15 - 41 U/L 21   ALT 0 - 44 U/L 31    Edinburgh Score:    02/28/2018   11:47 AM  Edinburgh Postnatal Depression Scale Screening Tool  I have been able to laugh and see the funny side of things. 0  I have looked forward with enjoyment to things. 0  I have blamed myself unnecessarily when things went wrong. 0  I have been anxious or worried for no good reason. 0  I have felt scared or panicky for no good reason. 0  Things have been getting on top of me. 0  I have been so unhappy that I have had difficulty sleeping. 0  I have felt sad or miserable. 0  I have been so unhappy that I have been crying. 0  The thought of harming myself has occurred to me. 0  Edinburgh Postnatal Depression Scale Total 0     After visit meds:  Allergies as of 01/04/2023   No Known Allergies      Medication List     STOP taking these medications    misoprostol 200 MCG tablet Commonly known as: CYTOTEC   oxyCODONE-acetaminophen 5-325 MG tablet Commonly known as: PERCOCET/ROXICET   promethazine 12.5 MG tablet Commonly known as: PHENERGAN       TAKE these medications    ibuprofen 600 MG tablet Commonly known as: ADVIL Take 1 tablet (600 mg total) by mouth every 6 (six) hours as needed for mild pain.         Discharge home in stable condition Infant Disposition: [facility], for cremation. Discharge instruction: per After Visit Summary and Postpartum booklet. Activity: Advance as tolerated. Pelvic rest for 3 weeks.  Diet: routine  diet Future Appointments: Future Appointments  Date Time Provider Department Center  01/13/2023  1:15 PM [facility] NURSE [facility]  01/13/2023  1:30 PM [facility] US3 [facility]   Follow up Visit:  Follow-up Information     Ob/Gyn, [facility]. Schedule an appointment as soon as possible for a visit in 1 week(s).   Specialty: Obstetrics and Gynecology Why: Postpartum and mood follow up. Contact information: [address] [place] [address] [phone]                 Delivery mode:  VBAC, Spontaneous Anticipated Birth Control:  Unsure 01/04/2023 Prescilla Sours, MD

## 2023-01-04 NOTE — Progress Notes (Signed)
CSW received consult due to IUFD.  CSW available for support secondary to [facility] and will await call from Chaplain before becoming involved.  CSW screening out referral at this time.  CSW spoke with Spiritual Care Provider and provider plans to meet with patient again prior to pt's discharge.  No CSW needs has been identified at this time.   Blaine Hamper, MSW, LCSW Clinical Social Work [phone]

## 2023-01-04 NOTE — Progress Notes (Signed)
Voicemail and message in EPIC left for Dr. Normand Sloop regarding Report of Fetal Death paperwork.

## 2023-01-04 NOTE — Progress Notes (Signed)
   01/04/23 1531  Departure Condition  Departure Condition Good  Mobility at [facility]  Patient/Caregiver Teaching Teach Back Method Used;Discharge instructions reviewed;Prescriptions reviewed;Follow-up care reviewed;Pain management discussed;Patient/caregiver verbalized understanding  Departure Mode By self  Was procedural sedation performed on this patient during this visit? No   Patient alert and oriented x4, VS and pain stable at discharge.

## 2023-01-05 LAB — ANTIPHOSPHOLIPID SYNDROME EVAL, BLD
Anticardiolipin IgA: <9 APL U/mL (ref 0–11)
Anticardiolipin IgG: <9 GPL U/mL (ref 0–14)
Anticardiolipin IgM: <9 MPL U/mL (ref 0–12)
PTT Lupus Anticoagulant: 32.7 s (ref 0.0–43.5)
Phosphatydalserine, IgA: <1 APS Units (ref 0–19)
Phosphatydalserine, IgM: 27 Units (ref 0–30)

## 2023-01-05 LAB — SURGICAL PATHOLOGY

## 2023-01-13 ENCOUNTER — Other Ambulatory Visit: Payer: Medicaid Other

## 2023-01-13 ENCOUNTER — Ambulatory Visit: Payer: Medicaid Other

## 2023-01-30 ENCOUNTER — Telehealth (HOSPITAL_COMMUNITY): Payer: Self-pay

## 2023-01-30 DIAGNOSIS — Z1331 Encounter for screening for depression: Secondary | ICD-10-CM

## 2023-01-30 NOTE — Telephone Encounter (Signed)
01/30/2023 1522  Name: Haliegh Whisenant MRN: [id number] DOB: [birth year]  Reason for Call:  Transition of Care Hospital Discharge Call  Contact Status: Patient Contact Status: Complete  Language assistant needed: Interpreter Mode: Interpreter Not Needed        Follow-Up Questions: Do You Have Any Concerns About Your Health As You Heal From Delivery?: No Do You Have Any Concerns About Your Infants Health?:  (Fetal Demise)  Edinburgh Postnatal Depression Scale:  In the Past 7 Days: I have been able to laugh and see the funny side of things.: Not quite so much now I have looked forward with enjoyment to things.: Rather less than I used to I have blamed myself unnecessarily when things went wrong.: No, never I have been anxious or worried for no good reason.: Yes, sometimes I have felt scared or panicky for no good reason.: No, not much Things have been getting on top of me.: No, most of the time I have coped quite well I have been so unhappy that I have had difficulty sleeping.: Yes, sometimes I have felt sad or miserable.: Yes, most of the time I have been so unhappy that I have been crying.: Yes, quite often The thought of harming myself has occurred to me.: Never Edinburgh Postnatal Depression Scale Total: (!) 13  PHQ2-9 Depression Scale:     Discharge Follow-up: Edinburgh score requires follow up?: Yes Provider notified of Edinburgh score?: Yes Have you already been referred for a counseling appointment?: No Patient was advised of the following resources:: Bereavement (Grief support for pregnancy, infant and child loss) Did patient express any COVID concerns?: No  Post-discharge interventions: Placed [facility] referral in patient's chart  Signature  Signe Colt

## 2023-02-01 ENCOUNTER — Encounter: Payer: Medicaid Other | Admitting: Advanced Practice Midwife

## 2023-05-04 ENCOUNTER — Other Ambulatory Visit: Payer: Self-pay | Admitting: Medical Genetics

## 2023-05-04 DIAGNOSIS — Z006 Encounter for examination for normal comparison and control in clinical research program: Secondary | ICD-10-CM

## 2023-05-13 ENCOUNTER — Encounter (INDEPENDENT_AMBULATORY_CARE_PROVIDER_SITE_OTHER): Payer: Self-pay

## 2023-06-05 ENCOUNTER — Other Ambulatory Visit (HOSPITAL_COMMUNITY): Payer: Medicaid Other | Attending: Medical Genetics

## 2023-08-15 ENCOUNTER — Ambulatory Visit: Payer: Medicaid Other | Admitting: Physician Assistant

## 2023-08-15 ENCOUNTER — Other Ambulatory Visit (HOSPITAL_COMMUNITY)
Admission: RE | Admit: 2023-08-15 | Discharge: 2023-08-15 | Disposition: A | Discharge status: Home / Self Care | Source: Ambulatory Visit | Attending: Physician Assistant | Admitting: Physician Assistant

## 2023-08-15 VITALS — BP 100/64 | HR 84 | Ht 60.0 in | Wt 156.0 lb

## 2023-08-15 DIAGNOSIS — Z113 Encounter for screening for infections with a predominantly sexual mode of transmission: Secondary | ICD-10-CM | POA: Diagnosis present

## 2023-08-15 DIAGNOSIS — D509 Iron deficiency anemia, unspecified: Secondary | ICD-10-CM | POA: Diagnosis not present

## 2023-08-15 DIAGNOSIS — N96 Recurrent pregnancy loss: Secondary | ICD-10-CM | POA: Diagnosis not present

## 2023-08-15 DIAGNOSIS — L700 Acne vulgaris: Secondary | ICD-10-CM | POA: Diagnosis not present

## 2023-08-15 LAB — CBC WITH DIFFERENTIAL/PLATELET
Basophils Absolute: 0 10*3/uL (ref 0.0–0.1)
Basophils Relative: 0.4 % (ref 0.0–3.0)
Eosinophils Absolute: 0 10*3/uL (ref 0.0–0.7)
Eosinophils Relative: 0.6 % (ref 0.0–5.0)
HCT: 37.6 % (ref 36.0–46.0)
Hemoglobin: 12 g/dL (ref 12.0–15.0)
Lymphocytes Relative: 33.9 % (ref 12.0–46.0)
Lymphs Abs: 1.3 10*3/uL (ref 0.7–4.0)
MCHC: 32 g/dL (ref 30.0–36.0)
MCV: 82.4 fl (ref 78.0–100.0)
Monocytes Absolute: 0.5 10*3/uL (ref 0.1–1.0)
Monocytes Relative: 12 % (ref 3.0–12.0)
Neutro Abs: 2 10*3/uL (ref 1.4–7.7)
Neutrophils Relative %: 53.1 % (ref 43.0–77.0)
Platelets: 279 10*3/uL (ref 150.0–400.0)
RBC: 4.56 Mil/uL (ref 3.87–5.11)
RDW: 15.3 % (ref 11.5–15.5)
WBC: 3.8 10*3/uL — ABNORMAL LOW (ref 4.0–10.5)

## 2023-08-15 LAB — COMPREHENSIVE METABOLIC PANEL
ALT: 27 U/L (ref 0–35)
AST: 23 U/L (ref 0–37)
Albumin: 4.5 g/dL (ref 3.5–5.2)
Alkaline Phosphatase: 50 U/L (ref 39–117)
BUN: 9 mg/dL (ref 6–23)
CO2: 31 meq/L (ref 19–32)
Calcium: 10.2 mg/dL (ref 8.4–10.5)
Chloride: 102 meq/L (ref 96–112)
Creatinine, Ser: 0.9 mg/dL (ref 0.40–1.20)
GFR: 84.82 mL/min (ref >60.00)
Glucose, Bld: 90 mg/dL (ref 70–99)
Potassium: 4.2 meq/L (ref 3.5–5.1)
Sodium: 139 meq/L (ref 135–145)
Total Bilirubin: 1.2 mg/dL (ref 0.2–1.2)
Total Protein: 7.6 g/dL (ref 6.0–8.3)

## 2023-08-15 LAB — TSH: TSH: 1.31 u[IU]/mL (ref 0.35–5.50)

## 2023-08-15 LAB — IBC + FERRITIN
Ferritin: 14.4 ng/mL (ref 10.0–291.0)
Iron: 76 ug/dL (ref 42–145)
Saturation Ratios: 19.4 % — ABNORMAL LOW (ref 20.0–50.0)
TIBC: 392 ug/dL (ref 250.0–450.0)
Transferrin: 280 mg/dL (ref 212.0–360.0)

## 2023-08-15 MED ORDER — TRETINOIN 0.025 % EX CREA
TOPICAL_CREAM | Freq: Every day | CUTANEOUS | 1 refills | Status: DC
Start: 2023-08-15 — End: 2024-01-02

## 2023-08-15 NOTE — Progress Notes (Signed)
 New patient visit   Patient: Christina Jennings   DOB: [birth year]   33 y.o. Female  MRN: [id number] Visit Date: 08/15/2023  Today's healthcare provider: Alfredia Ferguson, PA-C   Cc. New patient, establish care  Subjective    Christina Jennings is a 33 y.o. female who presents today as a new patient to establish care.   Discussed the use of AI scribe software for clinical note transcription with the patient, who gave verbal consent to proceed.  History of Present Illness   The patient, with a history of multiple pregnancies and miscarriages, initially scheduled an appointment due to flu-like symptoms. However, by the time of the appointment, the symptoms had resolved. The patient expressed a desire to establish regular care, as it had been a long time since her last regular check-up.   The patient has had two live births and several miscarriages, the most recent of which was at nineteen weeks. The patient expressed a desire to have another child in the future, but also expressed concerns about recurrent miscarriages and wondered if there might be an underlying issue causing them.   The patient also reported recurrent acne breakouts, particularly in the chin area, which she suspected might be hormonal.       Past Medical History:  Diagnosis Date   Arrest of dilation, delivered, current hospitalization 01/21/2018   Chlamydia 2010   Chorioamnionitis 01/21/2018   Infection    UTI   Missed abortion 12/29/2016   Polyhydramnios in third trimester 01/17/2018   S/P dilatation and curettage 12/29/2016   Past Surgical History:  Procedure Laterality Date   CESAREAN SECTION N/A 01/19/2018   Procedure: CESAREAN SECTION;  Surgeon: Conan Bowens, MD;  Location: [facility] BIRTHING SUITES;  Service: Obstetrics;  Laterality: N/A;   DILATION AND CURETTAGE OF UTERUS     DILATION AND EVACUATION N/A 12/29/2016   Procedure: DILATATION AND EVACUATION;  Surgeon: Hermina Staggers, MD;  Location: [facility] ORS;  Service: Gynecology;   Laterality: N/A;   WISDOM TOOTH EXTRACTION  2016   Family Status  Relation Name Status   Mother [name] Deceased at age 46   Father Vick Frees   Son Water engineer (Not Specified)   Mat Statistician (Not Specified)   MGM  Deceased   MGF  Deceased   PGM  Deceased   PGF  Alive  No partnership data on file   Family History  Problem Relation Age of Onset   Heart disease Mother    Drug abuse Mother    Miscarriages / [place] Mother    Heart disease Father    Cancer Father        prostate   Asthma Son    Diabetes Maternal Aunt    Social History   Socioeconomic History   Marital status: Single    Spouse name: Not on file   Number of children: Not on file   Years of education: Not on file   Highest education level: Associate degree: occupational, Scientist, product/process development, or vocational program  Occupational History   Not on file  Tobacco Use   Smoking status: Never   Smokeless tobacco: Never  Vaping Use   Vaping status: Never Used  Substance and Sexual Activity   Alcohol use: No   Drug use: No   Sexual activity: Yes    Birth control/protection: None  Other Topics Concern   Not on file  Social History Narrative   Not on file   Social Drivers of Corporate investment banker  Strain: Low Risk  (08/15/2023)   Overall Financial Resource Strain (CARDIA)    Difficulty of Paying Living Expenses: Not hard at all  Food Insecurity: No Food Insecurity (08/15/2023)   Hunger Vital Sign    Worried About Running Out of Food in the Last Year: Never true    Ran Out of Food in the Last Year: Never true  Transportation Needs: No Transportation Needs (08/15/2023)   PRAPARE - Administrator, Civil Service (Medical): No    Lack of Transportation (Non-Medical): No  Physical Activity: Unknown (08/15/2023)   Exercise Vital Sign    Days of Exercise per Week: 0 days    Minutes of Exercise per Session: Not on file  Stress: Stress Concern Present (08/15/2023)   [employer] of Occupational Health -  Occupational Stress Questionnaire    Feeling of Stress : Rather much  Social Connections: Socially Isolated (08/15/2023)   Social Connection and Isolation Panel [NHANES]    Frequency of Communication with Friends and Family: Once a week    Frequency of Social Gatherings with Friends and Family: Never    Attends Religious Services: Never    Database administrator or Organizations: No    Attends Engineer, structural: Not on file    Marital Status: Never married   Outpatient Medications Prior to Visit  Medication Sig   [DISCONTINUED] ibuprofen (ADVIL) 600 MG tablet Take 1 tablet (600 mg total) by mouth every 6 (six) hours as needed for mild pain.   No facility-administered medications prior to visit.   No Known Allergies  Immunization History  Administered Date(s) Administered   Tdap 10/26/2017    Health Maintenance  Topic Date Due   Hepatitis C Screening  Never done   Cervical Cancer Screening (HPV/Pap Cotest)  06/30/2021   COVID-19 Vaccine (1 - 2024-25 season) Never done   INFLUENZA VACCINE  09/11/2023 (Originally 01/12/2023)   DTaP/Tdap/Td (2 - Td or Tdap) 10/27/2027   HIV Screening  Completed   HPV VACCINES  Aged Out    Patient Care Team: Alfredia Ferguson, PA-C as PCP - General (Physician Assistant)  Review of Systems  Constitutional:  Negative for fatigue and fever.  Respiratory:  Negative for cough and shortness of breath.   Cardiovascular:  Negative for chest pain and leg swelling.  Gastrointestinal:  Negative for abdominal pain.  Neurological:  Negative for dizziness and headaches.        Objective    BP 100/64   Pulse 84   Ht 5' (1.524 m)   Wt 156 lb (70.8 kg)   LMP 07/31/2023 (Exact Date)   BMI 30.47 kg/m     Physical Exam Constitutional:      General: She is awake.     Appearance: She is well-developed.  HENT:     Head: Normocephalic.  Eyes:     Conjunctiva/sclera: Conjunctivae normal.  Cardiovascular:     Rate and Rhythm: Normal rate  and regular rhythm.     Heart sounds: Normal heart sounds.  Pulmonary:     Effort: Pulmonary effort is normal.     Breath sounds: Normal breath sounds.  Skin:    General: Skin is warm.  Neurological:     Mental Status: She is alert and oriented to person, place, and time.  Psychiatric:        Attention and Perception: Attention normal.        Mood and Affect: Mood normal.        Speech: Speech  normal.        Behavior: Behavior is cooperative.    Depression Screen    08/15/2023    9:55 AM 01/11/2018    8:50 AM 11/24/2017    3:51 PM 11/09/2017    4:11 PM  PHQ 2/9 Scores  PHQ - 2 Score 0 0 1 0  PHQ- 9 Score  4 7 3    No results found for any visits on 08/15/23.  Assessment & Plan     Iron deficiency anemia, unspecified iron deficiency anemia type Assessment & Plan: Historically Repeat cbc, iron panel  Orders: -     CBC with Differential/Platelet -     IBC + Ferritin  Acne vulgaris Assessment & Plan: - Prescribe tretinoin cream - Advise to apply a thin layer to affected areas, starting three days a week and increasing as tolerated - Discuss potential purging period   Orders: -     Comprehensive metabolic panel -     TSH -     Tretinoin; Apply topically at bedtime.  Dispense: 45 g; Refill: 1  History of multiple miscarriages Assessment & Plan: Interested in future pregnancy. - Refer to OB/GYN for further evaluation and testing  Orders: -     Ambulatory referral to Obstetrics / Gynecology  Routine screening for STI (sexually transmitted infection) -     Hepatitis C antibody -     HIV Antibody (routine testing w rflx) -     RPR -     Cervicovaginal ancillary only   General Health Maintenance - Refer to OB/GYN for Pap smear - Perform STD screening  -declines flu vaccine  Return if symptoms worsen or fail to improve.      Alfredia Ferguson, PA-C  [facility] at [facility] [phone] (phone) [phone] (fax)  [facility]

## 2023-08-15 NOTE — Assessment & Plan Note (Signed)
 Historically Repeat cbc, iron panel

## 2023-08-15 NOTE — Assessment & Plan Note (Signed)
 Interested in future pregnancy. - Refer to OB/GYN for further evaluation and testing

## 2023-08-15 NOTE — Assessment & Plan Note (Signed)
-   Prescribe tretinoin cream - Advise to apply a thin layer to affected areas, starting three days a week and increasing as tolerated - Discuss potential purging period

## 2023-08-16 LAB — CERVICOVAGINAL ANCILLARY ONLY
Bacterial Vaginitis (gardnerella): NEGATIVE
Candida Glabrata: NEGATIVE
Candida Vaginitis: NEGATIVE
Chlamydia: NEGATIVE
Comment: NEGATIVE
Comment: NEGATIVE
Comment: NEGATIVE
Comment: NEGATIVE
Comment: NEGATIVE
Comment: NORMAL
Neisseria Gonorrhea: NEGATIVE
Trichomonas: NEGATIVE

## 2023-08-16 LAB — HEPATITIS C ANTIBODY: Hepatitis C Ab: NONREACTIVE

## 2023-08-16 LAB — HIV ANTIBODY (ROUTINE TESTING W REFLEX): HIV 1&2 Ab, 4th Generation: NONREACTIVE

## 2023-08-16 LAB — RPR: RPR Ser Ql: NONREACTIVE

## 2023-08-17 ENCOUNTER — Encounter: Payer: Self-pay | Admitting: Physician Assistant

## 2023-08-17 ENCOUNTER — Other Ambulatory Visit: Payer: Self-pay | Admitting: *Deleted

## 2023-08-17 ENCOUNTER — Other Ambulatory Visit: Payer: Self-pay | Admitting: Physician Assistant

## 2023-08-17 DIAGNOSIS — D72819 Decreased white blood cell count, unspecified: Secondary | ICD-10-CM

## 2023-09-18 ENCOUNTER — Ambulatory Visit: Admitting: Physician Assistant

## 2023-09-18 NOTE — Progress Notes (Deleted)
      Established patient visit   Patient: Christina Jennings   DOB: 11-Apr-1991   33 y.o. Female  MRN: [id number] Visit Date: 09/18/2023  Today's healthcare provider: Alfredia Ferguson, PA-C   No chief complaint on file.  Subjective     ***  Medications: Outpatient Medications Prior to Visit  Medication Sig   tretinoin (RETIN-A) 0.025 % cream Apply topically at bedtime.   No facility-administered medications prior to visit.    Review of Systems {Insert previous labs (optional):23779} {See past labs  Heme  Chem  Endocrine  Serology  Results Review (optional):1}   Objective    There were no vitals taken for this visit. {Insert last BP/Wt (optional):23777}{See vitals history (optional):1}  Physical Exam  ***  No results found for any visits on 09/18/23.  Assessment & Plan    There are no diagnoses linked to this encounter.  ***  No follow-ups on file.       Alfredia Ferguson, PA-C  [facility] at [facility] [phone] (phone) [phone] (fax)  [facility]

## 2023-09-19 ENCOUNTER — Other Ambulatory Visit (INDEPENDENT_AMBULATORY_CARE_PROVIDER_SITE_OTHER)

## 2023-09-19 ENCOUNTER — Ambulatory Visit: Admitting: Physician Assistant

## 2023-09-19 DIAGNOSIS — D72819 Decreased white blood cell count, unspecified: Secondary | ICD-10-CM

## 2023-09-19 LAB — CBC WITH DIFFERENTIAL/PLATELET
Basophils Absolute: 0 10*3/uL (ref 0.0–0.1)
Basophils Relative: 0.4 % (ref 0.0–3.0)
Eosinophils Absolute: 0.1 10*3/uL (ref 0.0–0.7)
Eosinophils Relative: 1.8 % (ref 0.0–5.0)
HCT: 36.9 % (ref 36.0–46.0)
Hemoglobin: 12 g/dL (ref 12.0–15.0)
Lymphocytes Relative: 47.2 % — ABNORMAL HIGH (ref 12.0–46.0)
Lymphs Abs: 1.5 10*3/uL (ref 0.7–4.0)
MCHC: 32.6 g/dL (ref 30.0–36.0)
MCV: 81.4 fl (ref 78.0–100.0)
Monocytes Absolute: 0.3 10*3/uL (ref 0.1–1.0)
Monocytes Relative: 8.4 % (ref 3.0–12.0)
Neutro Abs: 1.3 10*3/uL — ABNORMAL LOW (ref 1.4–7.7)
Neutrophils Relative %: 42.2 % — ABNORMAL LOW (ref 43.0–77.0)
Platelets: 231 10*3/uL (ref 150.0–400.0)
RBC: 4.53 Mil/uL (ref 3.87–5.11)
RDW: 15.4 % (ref 11.5–15.5)
WBC: 3.2 10*3/uL — ABNORMAL LOW (ref 4.0–10.5)

## 2023-09-19 NOTE — Progress Notes (Deleted)
      Established patient visit   Patient: Christina Jennings   DOB: 10-05-1990   33 y.o. Female  MRN: [id number] Visit Date: 09/19/2023  Today's healthcare provider: Alfredia Ferguson, PA-C   No chief complaint on file.  Subjective     ***  Medications: Outpatient Medications Prior to Visit  Medication Sig   tretinoin (RETIN-A) 0.025 % cream Apply topically at bedtime.   No facility-administered medications prior to visit.    Review of Systems {Insert previous labs (optional):23779} {See past labs  Heme  Chem  Endocrine  Serology  Results Review (optional):1}   Objective    There were no vitals taken for this visit. {Insert last BP/Wt (optional):23777}{See vitals history (optional):1}  Physical Exam  ***  No results found for any visits on 09/19/23.  Assessment & Plan    There are no diagnoses linked to this encounter.  ***  No follow-ups on file.       Alfredia Ferguson, PA-C  [facility] at [facility] [phone] (phone) [phone] (fax)  [facility]

## 2023-09-20 ENCOUNTER — Other Ambulatory Visit: Payer: Self-pay | Admitting: Physician Assistant

## 2023-09-20 ENCOUNTER — Encounter: Payer: Self-pay | Admitting: Physician Assistant

## 2023-09-20 DIAGNOSIS — D709 Neutropenia, unspecified: Secondary | ICD-10-CM

## 2023-10-03 ENCOUNTER — Ambulatory Visit: Admitting: Physician Assistant

## 2023-10-03 ENCOUNTER — Other Ambulatory Visit (INDEPENDENT_AMBULATORY_CARE_PROVIDER_SITE_OTHER)

## 2023-10-03 ENCOUNTER — Other Ambulatory Visit

## 2023-10-03 DIAGNOSIS — D709 Neutropenia, unspecified: Secondary | ICD-10-CM | POA: Diagnosis not present

## 2023-10-03 LAB — COMPREHENSIVE METABOLIC PANEL WITH GFR
ALT: 33 U/L (ref 0–35)
AST: 16 U/L (ref 0–37)
Albumin: 4.1 g/dL (ref 3.5–5.2)
Alkaline Phosphatase: 49 U/L (ref 39–117)
BUN: 9 mg/dL (ref 6–23)
CO2: 28 meq/L (ref 19–32)
Calcium: 9.3 mg/dL (ref 8.4–10.5)
Chloride: 105 meq/L (ref 96–112)
Creatinine, Ser: 0.82 mg/dL (ref 0.40–1.20)
GFR: 94.75 mL/min (ref >60.00)
Glucose, Bld: 96 mg/dL (ref 70–99)
Potassium: 4.2 meq/L (ref 3.5–5.1)
Sodium: 140 meq/L (ref 135–145)
Total Bilirubin: 0.8 mg/dL (ref 0.2–1.2)
Total Protein: 6.9 g/dL (ref 6.0–8.3)

## 2023-10-03 LAB — VITAMIN B12: Vitamin B-12: 389 pg/mL (ref 211–911)

## 2023-10-03 LAB — FOLATE: Folate: 13.4 ng/mL (ref >5.9)

## 2023-10-05 LAB — CERULOPLASMIN: Ceruloplasmin: 23 mg/dL (ref 14–48)

## 2023-10-05 LAB — CBC WITH DIFFERENTIAL/PLATELET

## 2023-10-06 ENCOUNTER — Telehealth: Payer: Self-pay | Admitting: *Deleted

## 2023-10-06 DIAGNOSIS — D709 Neutropenia, unspecified: Secondary | ICD-10-CM

## 2023-10-06 NOTE — Telephone Encounter (Signed)
 Received notice from [facility] that they did not receive a lavender tube with specimen on 4/22.  We were having major computer / connectivity issues on Tuesday in the lab and may have contributed to test not being drawn.  I have contacted pt and she is returning Monday morning to repeat the CBC.  Apologized to pt for the inconvenience.

## 2023-10-09 ENCOUNTER — Other Ambulatory Visit (INDEPENDENT_AMBULATORY_CARE_PROVIDER_SITE_OTHER)

## 2023-10-09 DIAGNOSIS — D709 Neutropenia, unspecified: Secondary | ICD-10-CM

## 2023-10-09 LAB — CBC WITH DIFFERENTIAL/PLATELET
Absolute Lymphocytes: 1654 {cells}/uL (ref 850–3900)
Absolute Monocytes: 259 {cells}/uL (ref 200–950)
Basophils Absolute: 10 {cells}/uL (ref 0–200)
Basophils Relative: 0.3 %
Eosinophils Absolute: 29 {cells}/uL (ref 15–500)
Eosinophils Relative: 0.9 %
HCT: 35.9 % (ref 35.0–45.0)
Hemoglobin: 11.1 g/dL — ABNORMAL LOW (ref 11.7–15.5)
MCH: 25.4 pg — ABNORMAL LOW (ref 27.0–33.0)
MCHC: 30.9 g/dL — ABNORMAL LOW (ref 32.0–36.0)
MCV: 82.2 fL (ref 80.0–100.0)
MPV: 11.4 fL (ref 7.5–12.5)
Monocytes Relative: 8.1 %
Neutro Abs: 1248 {cells}/uL — ABNORMAL LOW (ref 1500–7800)
Neutrophils Relative %: 39 %
Platelets: 267 10*3/uL (ref 140–400)
RBC: 4.37 10*6/uL (ref 3.80–5.10)
RDW: 14.5 % (ref 11.0–15.0)
Total Lymphocyte: 51.7 %
WBC: 3.2 10*3/uL — ABNORMAL LOW (ref 3.8–10.8)

## 2023-10-11 ENCOUNTER — Other Ambulatory Visit: Payer: Self-pay | Admitting: Physician Assistant

## 2023-10-11 ENCOUNTER — Encounter: Payer: Self-pay | Admitting: Physician Assistant

## 2023-10-11 DIAGNOSIS — D649 Anemia, unspecified: Secondary | ICD-10-CM

## 2023-10-11 DIAGNOSIS — D72819 Decreased white blood cell count, unspecified: Secondary | ICD-10-CM

## 2023-10-18 ENCOUNTER — Encounter: Payer: Self-pay | Admitting: Hematology & Oncology

## 2024-01-02 ENCOUNTER — Inpatient Hospital Stay: Attending: Medical Oncology

## 2024-01-02 ENCOUNTER — Encounter: Payer: Self-pay | Admitting: Medical Oncology

## 2024-01-02 ENCOUNTER — Inpatient Hospital Stay: Admitting: Medical Oncology

## 2024-01-02 VITALS — BP 125/97 | HR 66 | Temp 98.1°F | Resp 18 | Ht 60.0 in | Wt 160.0 lb

## 2024-01-02 DIAGNOSIS — E611 Iron deficiency: Secondary | ICD-10-CM | POA: Diagnosis not present

## 2024-01-02 DIAGNOSIS — E538 Deficiency of other specified B group vitamins: Secondary | ICD-10-CM | POA: Insufficient documentation

## 2024-01-02 DIAGNOSIS — D72819 Decreased white blood cell count, unspecified: Secondary | ICD-10-CM | POA: Diagnosis present

## 2024-01-02 LAB — RETIC PANEL
Immature Retic Fract: 9 % (ref 2.3–15.9)
RBC.: 4.42 MIL/uL (ref 3.87–5.11)
Retic Count, Absolute: 41.1 K/uL (ref 19.0–186.0)
Retic Ct Pct: 0.9 % (ref 0.4–3.1)
Reticulocyte Hemoglobin: 29.7 pg (ref >27.9)

## 2024-01-02 LAB — CMP (CANCER CENTER ONLY)
ALT: 46 U/L — ABNORMAL HIGH (ref 0–44)
AST: 22 U/L (ref 15–41)
Albumin: 4.5 g/dL (ref 3.5–5.0)
Alkaline Phosphatase: 52 U/L (ref 38–126)
Anion gap: 11 (ref 5–15)
BUN: 9 mg/dL (ref 6–20)
CO2: 28 mmol/L (ref 22–32)
Calcium: 10 mg/dL (ref 8.9–10.3)
Chloride: 103 mmol/L (ref 98–111)
Creatinine: 0.85 mg/dL (ref 0.44–1.00)
GFR, Estimated: >60 mL/min (ref >60)
Glucose, Bld: 86 mg/dL (ref 70–99)
Potassium: 3.7 mmol/L (ref 3.5–5.1)
Sodium: 142 mmol/L (ref 135–145)
Total Bilirubin: 0.6 mg/dL (ref 0.0–1.2)
Total Protein: 7.6 g/dL (ref 6.5–8.1)

## 2024-01-02 LAB — CBC WITH DIFFERENTIAL (CANCER CENTER ONLY)
Abs Immature Granulocytes: 0.02 K/uL (ref 0.00–0.07)
Basophils Absolute: 0 K/uL (ref 0.0–0.1)
Basophils Relative: 0 %
Eosinophils Absolute: 0.1 K/uL (ref 0.0–0.5)
Eosinophils Relative: 1 %
HCT: 36.6 % (ref 36.0–46.0)
Hemoglobin: 11.5 g/dL — ABNORMAL LOW (ref 12.0–15.0)
Immature Granulocytes: 1 %
Lymphocytes Relative: 48 %
Lymphs Abs: 1.9 K/uL (ref 0.7–4.0)
MCH: 25.9 pg — ABNORMAL LOW (ref 26.0–34.0)
MCHC: 31.4 g/dL (ref 30.0–36.0)
MCV: 82.4 fL (ref 80.0–100.0)
Monocytes Absolute: 0.3 K/uL (ref 0.1–1.0)
Monocytes Relative: 8 %
Neutro Abs: 1.6 K/uL — ABNORMAL LOW (ref 1.7–7.7)
Neutrophils Relative %: 42 %
Platelet Count: 246 K/uL (ref 150–400)
RBC: 4.44 MIL/uL (ref 3.87–5.11)
RDW: 15.2 % (ref 11.5–15.5)
WBC Count: 3.9 K/uL — ABNORMAL LOW (ref 4.0–10.5)
nRBC: 0 % (ref 0.0–0.2)

## 2024-01-02 LAB — IRON AND IRON BINDING CAPACITY (CC-WL,HP ONLY)
Iron: 52 ug/dL (ref 28–170)
Saturation Ratios: 12 % (ref 10.4–31.8)
TIBC: 428 ug/dL (ref 250–450)
UIBC: 376 ug/dL

## 2024-01-02 LAB — FOLATE: Folate: 15.2 ng/mL (ref >5.9)

## 2024-01-02 LAB — FERRITIN: Ferritin: 9 ng/mL — ABNORMAL LOW (ref 11–307)

## 2024-01-02 LAB — VITAMIN B12: Vitamin B-12: 409 pg/mL (ref 180–914)

## 2024-01-02 LAB — C-REACTIVE PROTEIN: CRP: 0.6 mg/dL (ref <1.0)

## 2024-01-02 LAB — SEDIMENTATION RATE: Sed Rate: 18 mm/h (ref 0–22)

## 2024-01-02 NOTE — Progress Notes (Signed)
 [facility] [phone]   [phone]  INITIAL CONSULT NOTE  Patient Care Team: Cyndi Shaver, PA-C as PCP - General (Physician Assistant)  CHIEF COMPLAINTS/PURPOSE OF CONSULTATION:  Abnormal CBC  HISTORY OF PRESENTING ILLNESS:  Christina Jennings 33 y.o. female is referred to our office by their PCP Shaver Cyndi PA-C for abnormal CBC.   She reports that she has has anemia for many years but recently she switched to a new PCP and has labs completed. She was found to have mild anemia of unknown origin as well as mild leukopenia.   She does have a history of B12 deficiency. She does not currently take B12 supplementation.  Menstrual cycles are irregular. Menstrual cycles are lighter in nature.  There has been no bleeding to her knowledge: denies epistaxis, gingivitis, hemoptysis, hematemesis, hematuria, melena, excessive bruising, blood donation.  She works from home. 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  No night sweats, unintentional weight loss.  She feels that she does not get sick more often than the average person.  No history of chemotherapy exposure No known family history of blood disorders.  She currently takes a prebiotic and COQ10 She has ordered Cellfuel with sea moss, Prenatal, prebiotic. She will hold off on taking the Cellfuel until follow up.    MEDICAL HISTORY:  Past Medical History:  Diagnosis Date   Arrest of dilation, delivered, current hospitalization 01/21/2018   Chlamydia 2010   Chorioamnionitis 01/21/2018   Infection    UTI   Missed abortion 12/29/2016   Polyhydramnios in third trimester 01/17/2018   S/P dilatation and curettage 12/29/2016    SURGICAL HISTORY: Past Surgical History:  Procedure Laterality Date   CESAREAN SECTION N/A 01/19/2018   Procedure: CESAREAN SECTION;  Surgeon: Nicholaus Burnard HERO, MD;  Location: [facility] BIRTHING SUITES;  Service: Obstetrics;  Laterality: N/A;   DILATION  AND CURETTAGE OF UTERUS     DILATION AND EVACUATION N/A 12/29/2016   Procedure: DILATATION AND EVACUATION;  Surgeon: Lorence Ozell CROME, MD;  Location: [facility] ORS;  Service: Gynecology;  Laterality: N/A;   WISDOM TOOTH EXTRACTION  2016    SOCIAL HISTORY: Social History   Socioeconomic History   Marital status: Single    Spouse name: Not on file   Number of children: Not on file   Years of education: Not on file   Highest education level: Associate degree: occupational, Scientist, product/process development, or vocational program  Occupational History   Not on file  Tobacco Use   Smoking status: Never   Smokeless tobacco: Never  Vaping Use   Vaping status: Never Used  Substance and Sexual Activity   Alcohol use: No   Drug use: No   Sexual activity: Yes    Birth control/protection: None  Other Topics Concern   Not on file  Social History Narrative   Not on file   Social Drivers of Health   Financial Resource Strain: Low Risk  (08/15/2023)   Overall Financial Resource Strain (CARDIA)    Difficulty of Paying Living Expenses: Not hard at all  Food Insecurity: No Food Insecurity (01/02/2024)   Hunger Vital Sign    Worried About Running Out of Food in the Last Year: Never true    Ran Out of Food in the Last Year: Never true  Transportation Needs: No Transportation Needs (01/02/2024)   PRAPARE - Administrator, Civil Service (Medical): No    Lack of Transportation (Non-Medical): No  Physical Activity:  Unknown (08/15/2023)   Exercise Vital Sign    Days of Exercise per Week: 0 days    Minutes of Exercise per Session: Not on file  Stress: Stress Concern Present (08/15/2023)   [employer] of Occupational Health - Occupational Stress Questionnaire    Feeling of Stress : Rather much  Social Connections: Socially Isolated (08/15/2023)   Social Connection and Isolation Panel    Frequency of Communication with Friends and Family: Once a week    Frequency of Social Gatherings with Friends and Family: Never     Attends Religious Services: Never    Database administrator or Organizations: No    Attends Engineer, structural: Not on file    Marital Status: Never married  Intimate Partner Violence: Not At Risk (01/02/2024)   Humiliation, Afraid, Rape, and Kick questionnaire    Fear of Current or Ex-Partner: No    Emotionally Abused: No    Physically Abused: No    Sexually Abused: No    FAMILY HISTORY: Family History  Problem Relation Age of Onset   Heart disease Mother    Drug abuse Mother    Miscarriages / Stillbirths Mother    Heart disease Father    Cancer Father        prostate   Asthma Son    Diabetes Maternal Aunt     ALLERGIES:  has no known allergies.  MEDICATIONS:  No current outpatient medications on file.   No current facility-administered medications for this visit.    REVIEW OF SYSTEMS:   Constitutional: ( - ) fevers, ( - )  chills , ( - ) night sweats Eyes: ( - ) blurriness of vision, ( - ) double vision, ( - ) watery eyes Ears, nose, mouth, throat, and face: ( - ) mucositis, ( - ) sore throat Respiratory: ( - ) cough, ( - ) dyspnea, ( - ) wheezes Cardiovascular: ( - ) palpitation, ( - ) chest discomfort, ( - ) lower extremity swelling Gastrointestinal:  ( - ) nausea, ( - ) heartburn, ( - ) change in bowel habits Skin: ( - ) abnormal skin rashes Lymphatics: ( - ) new lymphadenopathy, ( - ) easy bruising Neurological: ( - ) numbness, ( - ) tingling, ( - ) new weaknesses Behavioral/Psych: ( - ) mood change, ( - ) new changes  All other systems were reviewed with the patient and are negative.  PHYSICAL EXAMINATION: ECOG PERFORMANCE STATUS: 1 - Symptomatic but completely ambulatory  Vitals:   01/02/24 1313  BP: (!) 125/97  Pulse: 66  Resp: 18  Temp: 98.1 F (36.7 C)  SpO2: 100%   Filed Weights   01/02/24 1313  Weight: 160 lb 0.6 oz (72.6 kg)    GENERAL: well appearing female in NAD  SKIN: skin color, texture, turgor are normal, no rashes or  significant lesions EYES: conjunctiva are pink and non-injected, sclera clear OROPHARYNX: no exudate, no erythema; lips, buccal mucosa, and tongue normal  NECK: supple, non-tender LYMPH:  no palpable lymphadenopathy in the cervical, axillary or supraclavicular lymph nodes.  LUNGS: clear to auscultation and percussion with normal breathing effort HEART: regular rate & rhythm and no murmurs and no lower extremity edema ABDOMEN: soft, non-tender, non-distended, normal bowel sounds Musculoskeletal: no cyanosis of digits and no clubbing  PSYCH: alert & oriented x 3, fluent speech NEURO: no focal motor/sensory deficits  LABORATORY DATA:  Pending   ASSESSMENT & PLAN Christina Jennings is a 33 y.o. African American female  who was referred to us  for an abnormal CBC.   The differentials for neutropenia include benign ethnic neutropenia, infectious etiology, nutritional etiology, inflammatory etiology, or medication.  The patient is not currently taking any medications known to cause neutropenia.  We will order viral studies with hep B, hep C, and HIV as well.  In addition, we will check for nutritional anemias with B12 and folate levels.    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  This is a common condition whereby individuals of African or Mediterranean descent tend to have slightly lower white blood cell counts in the general population.  Hallmark of the syndrome is an ANC between 1000 and 1500 which this patient falls into.  This is of little to no clinical consequence.  This is a diagnosis of exclusion therefore we will do further work-up in order to ensure there is no other etiology.   Assuming none of the studies show any concerning abnormalities, we will see the patient back in 6 months time to continue to monitor.      # Leukopenia/Neutropenia  --She has had negative screening tests for hepatitis B serologies, hepatitis C serologies, and HIV recently  --Nutritional evaluation  with vitamin B12, MMA and folate. Iron studies as well given her normocytic anemia --Inflammatory evaluation with ESR and CRP.   --Repeat CBC and CMP.    RTC 2 months APP, labs   All questions were answered. The patient knows to call the clinic with any problems, questions or concerns.  I have spent a total of 40 minutes minutes of face-to-face and non-face-to-face time, preparing to see the patient, obtaining and/or reviewing separately obtained history, performing a medically appropriate examination, counseling and educating the patient, ordering medications/tests/procedures, referring and communicating with other health care professionals, documenting clinical information in the electronic health record, independently interpreting results and communicating results to the patient, and care coordination.    Lauraine Dais PA-C Department of Hematology/Oncology [facility]

## 2024-01-03 ENCOUNTER — Ambulatory Visit: Payer: Self-pay | Admitting: Medical Oncology

## 2024-01-03 ENCOUNTER — Other Ambulatory Visit: Payer: Self-pay | Admitting: Medical Oncology

## 2024-01-03 DIAGNOSIS — D72819 Decreased white blood cell count, unspecified: Secondary | ICD-10-CM

## 2024-01-03 DIAGNOSIS — E611 Iron deficiency: Secondary | ICD-10-CM

## 2024-01-03 DIAGNOSIS — E538 Deficiency of other specified B group vitamins: Secondary | ICD-10-CM

## 2024-01-05 LAB — METHYLMALONIC ACID, SERUM: Methylmalonic Acid, Quantitative: 112 nmol/L (ref 0–378)

## 2024-03-04 ENCOUNTER — Inpatient Hospital Stay: Attending: Medical Oncology

## 2024-03-04 ENCOUNTER — Encounter: Payer: Self-pay | Admitting: Medical Oncology

## 2024-03-04 ENCOUNTER — Inpatient Hospital Stay (HOSPITAL_BASED_OUTPATIENT_CLINIC_OR_DEPARTMENT_OTHER): Admitting: Medical Oncology

## 2024-03-04 VITALS — BP 92/43 | HR 79 | Temp 98.1°F | Resp 18 | Ht 60.0 in | Wt 162.1 lb

## 2024-03-04 DIAGNOSIS — E538 Deficiency of other specified B group vitamins: Secondary | ICD-10-CM | POA: Insufficient documentation

## 2024-03-04 DIAGNOSIS — D72819 Decreased white blood cell count, unspecified: Secondary | ICD-10-CM

## 2024-03-04 DIAGNOSIS — D709 Neutropenia, unspecified: Secondary | ICD-10-CM | POA: Insufficient documentation

## 2024-03-04 DIAGNOSIS — E611 Iron deficiency: Secondary | ICD-10-CM

## 2024-03-04 DIAGNOSIS — Z87440 Personal history of urinary (tract) infections: Secondary | ICD-10-CM | POA: Diagnosis not present

## 2024-03-04 DIAGNOSIS — D649 Anemia, unspecified: Secondary | ICD-10-CM | POA: Insufficient documentation

## 2024-03-04 LAB — RETIC PANEL
Immature Retic Fract: 5.3 % (ref 2.3–15.9)
RBC.: 4.41 MIL/uL (ref 3.87–5.11)
Retic Count, Absolute: 49.8 K/uL (ref 19.0–186.0)
Retic Ct Pct: 1.1 % (ref 0.4–3.1)
Reticulocyte Hemoglobin: 30.5 pg (ref >27.9)

## 2024-03-04 LAB — CBC WITH DIFFERENTIAL (CANCER CENTER ONLY)
Abs Immature Granulocytes: 0.01 K/uL (ref 0.00–0.07)
Basophils Absolute: 0 K/uL (ref 0.0–0.1)
Basophils Relative: 0 %
Eosinophils Absolute: 0 K/uL (ref 0.0–0.5)
Eosinophils Relative: 1 %
HCT: 35.8 % — ABNORMAL LOW (ref 36.0–46.0)
Hemoglobin: 11.5 g/dL — ABNORMAL LOW (ref 12.0–15.0)
Immature Granulocytes: 0 %
Lymphocytes Relative: 48 %
Lymphs Abs: 1.6 K/uL (ref 0.7–4.0)
MCH: 26.4 pg (ref 26.0–34.0)
MCHC: 32.1 g/dL (ref 30.0–36.0)
MCV: 82.1 fL (ref 80.0–100.0)
Monocytes Absolute: 0.2 K/uL (ref 0.1–1.0)
Monocytes Relative: 6 %
Neutro Abs: 1.5 K/uL — ABNORMAL LOW (ref 1.7–7.7)
Neutrophils Relative %: 45 %
Platelet Count: 242 K/uL (ref 150–400)
RBC: 4.36 MIL/uL (ref 3.87–5.11)
RDW: 15.6 % — ABNORMAL HIGH (ref 11.5–15.5)
Smear Review: NORMAL
WBC Count: 3.3 K/uL — ABNORMAL LOW (ref 4.0–10.5)
nRBC: 0 % (ref 0.0–0.2)

## 2024-03-04 LAB — CMP (CANCER CENTER ONLY)
ALT: 45 U/L — ABNORMAL HIGH (ref 0–44)
AST: 24 U/L (ref 15–41)
Albumin: 4.7 g/dL (ref 3.5–5.0)
Alkaline Phosphatase: 51 U/L (ref 38–126)
Anion gap: 11 (ref 5–15)
BUN: 10 mg/dL (ref 6–20)
CO2: 26 mmol/L (ref 22–32)
Calcium: 9.9 mg/dL (ref 8.9–10.3)
Chloride: 105 mmol/L (ref 98–111)
Creatinine: 0.88 mg/dL (ref 0.44–1.00)
GFR, Estimated: >60 mL/min (ref >60)
Glucose, Bld: 120 mg/dL — ABNORMAL HIGH (ref 70–99)
Potassium: 4 mmol/L (ref 3.5–5.1)
Sodium: 142 mmol/L (ref 135–145)
Total Bilirubin: 0.7 mg/dL (ref 0.0–1.2)
Total Protein: 7.6 g/dL (ref 6.5–8.1)

## 2024-03-04 LAB — FERRITIN: Ferritin: 22 ng/mL (ref 11–307)

## 2024-03-04 LAB — IRON AND IRON BINDING CAPACITY (CC-WL,HP ONLY)
Iron: 110 ug/dL (ref 28–170)
Saturation Ratios: 29 % (ref 10.4–31.8)
TIBC: 382 ug/dL (ref 250–450)
UIBC: 272 ug/dL

## 2024-03-04 LAB — VITAMIN B12: Vitamin B-12: 434 pg/mL (ref 180–914)

## 2024-03-04 NOTE — Progress Notes (Signed)
 Hematology and Oncology Follow Up Visit  Christina Jennings [id number] 1991-01-19 33 y.o. 03/04/2024  Past Medical History:  Diagnosis Date   Arrest of dilation, delivered, current hospitalization 01/21/2018   Chlamydia 2010   Chorioamnionitis 01/21/2018   Infection    UTI   Missed abortion 12/29/2016   Polyhydramnios in third trimester 01/17/2018   S/P dilatation and curettage 12/29/2016    Principle Diagnosis:  Neutropenia Iron Deficiency   Current Therapy:   Observation  PriorTherapy:   IV Feraheme - 2019 d/c due to chest pains/muscle cramps Oral iron- ineffective     Interim History:  Christina Jennings is back for follow-up for abnormal CBC counts. This is her second visit to our office.   Today she states that she has been well. She has no concerns.   There has been no bleeding to her knowledge: denies epistaxis, gingivitis, hemoptysis, hematemesis, hematuria, melena, excessive bruising, blood donation.    No night sweats, unintentional weight loss. No recent infections or illness     Wt Readings from Last 3 Encounters:  03/04/24 162 lb 1.9 oz (73.5 kg)  01/02/24 160 lb 0.6 oz (72.6 kg)  08/15/23 156 lb (70.8 kg)     Medications:  No current outpatient medications on file.  Allergies:  Allergies  Allergen Reactions   Feraheme  [Ferumoxytol ] Other (See Comments)    Chest pain and cramps    Past Medical History, Surgical history, Social history, and Family History were reviewed and updated.  Review of Systems: Review of Systems  Constitutional: Negative.   HENT:  Negative.    Eyes: Negative.   Respiratory: Negative.    Cardiovascular: Negative.   Gastrointestinal: Negative.   Endocrine: Negative.   Genitourinary: Negative.    Musculoskeletal: Negative.   Skin: Negative.   Neurological: Negative.   Hematological: Negative.   Psychiatric/Behavioral: Negative.       Physical Exam:  height is 5' (1.524 m) and weight is 162 lb 1.9 oz (73.5 kg). Her oral temperature  is 98.1 F (36.7 C). Her blood pressure is 92/43 (abnormal) and her pulse is 79. Her respiration is 18 and oxygen saturation is 100%.   Physical Exam General: NAD Cardiovascular: regular rate and rhythm Pulmonary: clear ant fields Abdomen: soft, nontender, + bowel sounds GU: no suprapubic tenderness Extremities: no edema, no joint deformities Skin: no rashes Neurological: Weakness but otherwise nonfocal   Lab Results  Component Value Date   WBC 3.3 (L) 03/04/2024   HGB 11.5 (L) 03/04/2024   HCT 35.8 (L) 03/04/2024   MCV 82.1 03/04/2024   PLT 242 03/04/2024     Chemistry      Component Value Date/Time   NA 142 03/04/2024 1144   K 4.0 03/04/2024 1144   CL 105 03/04/2024 1144   CO2 26 03/04/2024 1144   BUN 10 03/04/2024 1144   CREATININE 0.88 03/04/2024 1144      Component Value Date/Time   CALCIUM 9.9 03/04/2024 1144   ALKPHOS 51 03/04/2024 1144   AST 24 03/04/2024 1144   ALT 45 (H) 03/04/2024 1144   BILITOT 0.7 03/04/2024 1144     Encounter Diagnoses  Name Primary?   Leukopenia, unspecified type Yes   Iron deficiency    B12 deficiency    Assessment and Plan- Patient is a 33 y.o. female who was referred to us  for neutropenia and anemia.   At her initial visit she had a normal CRP, sed rate. B12 and Methylmalonic acid were normal to low normal. Iron levels were low  with a ferritin of 9 and iron saturation of 12%. She has no known kidney dysfunction. She has a scantly elevated SLT but has no known history of liver dysfunction. Retic count is normal. It was recommended that she start a daily B12 and iron supplement.   Today WBC is 3.3 with ANC of 1.5. Hgb 11.5, MCV 82.1. Platelets 242. Generally stable.  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 Will continue monitoring.   In terms of her IDA. If iron levels continues to be low I would suggest IV iron (avoiding Feraheme ) with premedications which I have discussed with patient.   She will  discuss her scant LFT elevation and chronic hypotension with her PCP.   Disposition: RTC 3 months APP, labs (CBC w, CMP, LDH, iron, ferritin, B12)   Lauraine Dais PA-C 9/22/20252:28 PM

## 2024-03-05 ENCOUNTER — Ambulatory Visit: Payer: Self-pay | Admitting: Medical Oncology

## 2024-04-10 ENCOUNTER — Other Ambulatory Visit: Payer: Self-pay | Admitting: Medical Genetics

## 2024-04-10 DIAGNOSIS — Z006 Encounter for examination for normal comparison and control in clinical research program: Secondary | ICD-10-CM

## 2024-06-03 ENCOUNTER — Ambulatory Visit: Admitting: Medical Oncology

## 2024-06-03 ENCOUNTER — Inpatient Hospital Stay: Attending: Medical Oncology
